# Patient Record
Sex: Female | Born: 1940 | Race: White | Hispanic: No | Marital: Married | Smoking: Former smoker
Health system: Southern US, Community
[De-identification: ages and names within clinical notes are randomized; demographics above are authoritative.]

## PROBLEM LIST (undated history)

## (undated) DIAGNOSIS — R000 Tachycardia, unspecified: Secondary | ICD-10-CM

## (undated) DIAGNOSIS — G4700 Insomnia, unspecified: Secondary | ICD-10-CM

## (undated) DIAGNOSIS — C4490 Unspecified malignant neoplasm of skin, unspecified: Secondary | ICD-10-CM

## (undated) DIAGNOSIS — E785 Hyperlipidemia, unspecified: Secondary | ICD-10-CM

## (undated) DIAGNOSIS — I10 Essential (primary) hypertension: Secondary | ICD-10-CM

## (undated) DIAGNOSIS — M8580 Other specified disorders of bone density and structure, unspecified site: Secondary | ICD-10-CM

## (undated) DIAGNOSIS — F419 Anxiety disorder, unspecified: Secondary | ICD-10-CM

## (undated) DIAGNOSIS — E079 Disorder of thyroid, unspecified: Secondary | ICD-10-CM

## (undated) DIAGNOSIS — K219 Gastro-esophageal reflux disease without esophagitis: Secondary | ICD-10-CM

## (undated) HISTORY — DX: Essential (primary) hypertension: I10

## (undated) HISTORY — DX: Disorder of thyroid, unspecified: E07.9

## (undated) HISTORY — DX: Hyperlipidemia, unspecified: E78.5

## (undated) HISTORY — DX: Tachycardia, unspecified: R00.0

## (undated) HISTORY — PX: TONSILLECTOMY: SUR1361

## (undated) HISTORY — DX: Other specified disorders of bone density and structure, unspecified site: M85.80

## (undated) HISTORY — DX: Insomnia, unspecified: G47.00

## (undated) HISTORY — DX: Gastro-esophageal reflux disease without esophagitis: K21.9

## (undated) HISTORY — DX: Unspecified malignant neoplasm of skin, unspecified: C44.90

## (undated) HISTORY — DX: Anxiety disorder, unspecified: F41.9

---

## 2005-02-16 DIAGNOSIS — J309 Allergic rhinitis, unspecified: Secondary | ICD-10-CM | POA: Insufficient documentation

## 2005-02-16 DIAGNOSIS — F411 Generalized anxiety disorder: Secondary | ICD-10-CM | POA: Insufficient documentation

## 2009-05-17 DIAGNOSIS — C439 Malignant melanoma of skin, unspecified: Secondary | ICD-10-CM

## 2009-05-17 HISTORY — PX: MELANOMA EXCISION: SHX5266

## 2009-05-17 HISTORY — DX: Malignant melanoma of skin, unspecified: C43.9

## 2010-03-04 DIAGNOSIS — M8580 Other specified disorders of bone density and structure, unspecified site: Secondary | ICD-10-CM | POA: Insufficient documentation

## 2010-07-04 ENCOUNTER — Ambulatory Visit: Payer: Self-pay | Admitting: Unknown Physician Specialty

## 2010-07-04 LAB — HM COLONOSCOPY

## 2010-07-07 LAB — PATHOLOGY REPORT

## 2012-11-15 ENCOUNTER — Ambulatory Visit: Payer: Self-pay | Admitting: Unknown Physician Specialty

## 2013-08-16 ENCOUNTER — Ambulatory Visit: Payer: Self-pay | Admitting: Otolaryngology

## 2013-08-24 ENCOUNTER — Ambulatory Visit: Payer: Self-pay | Admitting: Otolaryngology

## 2013-08-30 ENCOUNTER — Ambulatory Visit: Payer: Self-pay | Admitting: Otolaryngology

## 2014-03-23 DIAGNOSIS — H6123 Impacted cerumen, bilateral: Secondary | ICD-10-CM | POA: Diagnosis not present

## 2014-03-23 DIAGNOSIS — H6983 Other specified disorders of Eustachian tube, bilateral: Secondary | ICD-10-CM | POA: Diagnosis not present

## 2014-03-23 DIAGNOSIS — J340 Abscess, furuncle and carbuncle of nose: Secondary | ICD-10-CM | POA: Diagnosis not present

## 2014-04-17 DIAGNOSIS — J40 Bronchitis, not specified as acute or chronic: Secondary | ICD-10-CM | POA: Diagnosis not present

## 2014-04-17 DIAGNOSIS — J329 Chronic sinusitis, unspecified: Secondary | ICD-10-CM | POA: Diagnosis not present

## 2014-05-29 DIAGNOSIS — Z91018 Allergy to other foods: Secondary | ICD-10-CM | POA: Diagnosis not present

## 2014-05-29 DIAGNOSIS — J301 Allergic rhinitis due to pollen: Secondary | ICD-10-CM | POA: Diagnosis not present

## 2014-05-29 DIAGNOSIS — H1045 Other chronic allergic conjunctivitis: Secondary | ICD-10-CM | POA: Diagnosis not present

## 2014-06-09 NOTE — Op Note (Signed)
PATIENT NAME:  Shannon Pennington, Shannon Pennington MR#:  [id number] DATE OF BIRTH:  06/01/1940  DATE OF PROCEDURE:  08/24/2013  PREOPERATIVE DIAGNOSES: Left cerumen impaction and conductive hearing loss.   POSTOPERATIVE DIAGNOSES:  1.  Left cerumen impaction.  2.  Conductive hearing loss.  3.  Tympanic membrane perforation.  4.  Cholesteatoma.   SURGEON: Carloyn Manner, M.D.   ANESTHESIA: General mask anesthesia.   ESTIMATED BLOOD LOSS: Less than 5 mL.   IV FLUIDS: Please see anesthesia record.   COMPLICATIONS: None.   DRAINS/STENT PLACEMENTS: None.   SPECIMENS: None.   INDICATIONS FOR PROCEDURE: The patient is a 74 year old female with history of a persistent cerumen impaction and crusting on her canal wall and inferior tympanic membrane, unable to be removed in the office, presenting for evaluation under anesthesia.   OPERATIVE FINDINGS: A large crusting, feeling unsafe retraction pocket of the inferior tympanic membrane. Following removal of the crust the pocket was noted to have squamous debris beneath it as well as a posterior/inferior TM perforation.   DESCRIPTION OF PROCEDURE: The patient was identified in holding and the benefits and risks of the procedure were discussed and consent was reviewed. The patient was taken to the operating room and placed in the supine position. General mask anesthesia was induced. The operating microscope was brought into the field. An appropriate-size speculum was placed in patient's right external auditory canal. Impacted cerumen and a small amount of squamous debris was removed using a cerumen loop.   Attention was directed to the patient's left ear in a similar fashion. There was a large crust on the posterior and inferior canal wall, as well as the drum.  Using a Constance Holster pick this was slightly lifted up; this demonstrated a cavity beneath this involving the posterior/inferior tympanic membrane. The crusting and squamous debris were removed with a combination of  the Rosen pick and size 5 otologic suction. Visualization and exploration of the retraction pocket revealed a posterior TM perforation and some further squamous debris as well as some attachment of the cholesteatoma to the ossicles and partial erosion.   At this time in Ciprodex drops were placed after further removal of all the wax and care of the patient was transferred to anesthesia.    ____________________________ Jerene Bears, MD ccv:lt D: 08/24/2013 08:46:41 ET T: 08/24/2013 09:21:58 ET JOB#: [id number]  cc: Jerene Bears, MD, <Dictator> Jerene Bears MD ELECTRONICALLY SIGNED 08/29/2013 18:08

## 2014-06-18 ENCOUNTER — Ambulatory Visit: Payer: Self-pay | Admitting: Podiatry

## 2014-06-26 DIAGNOSIS — J301 Allergic rhinitis due to pollen: Secondary | ICD-10-CM | POA: Diagnosis not present

## 2014-06-26 DIAGNOSIS — H7112 Cholesteatoma of tympanum, left ear: Secondary | ICD-10-CM | POA: Diagnosis not present

## 2014-09-09 ENCOUNTER — Encounter: Payer: Self-pay | Admitting: Family Medicine

## 2014-09-11 ENCOUNTER — Telehealth: Payer: Self-pay | Admitting: Family Medicine

## 2014-09-11 MED ORDER — LORAZEPAM 0.5 MG PO TABS: 0.5000 mg | ORAL_TABLET | Freq: Every day | ORAL | Status: DC

## 2014-09-11 NOTE — Telephone Encounter (Signed)
Rx called in to pharmacy. 

## 2014-09-11 NOTE — Telephone Encounter (Signed)
Please call in rx for lorazepam 0.5mg  one daily as needed, #30 rx x 2. Thanks.       Will you refill my prescription for Lorazepam 0.5mg  tablets at [facility] on [address] [place], [place] ? My current RX # [id number] and I have two pills left.

## 2014-09-14 ENCOUNTER — Other Ambulatory Visit: Payer: Self-pay | Admitting: Family Medicine

## 2014-09-19 ENCOUNTER — Ambulatory Visit (INDEPENDENT_AMBULATORY_CARE_PROVIDER_SITE_OTHER): Payer: Medicare Other | Admitting: Family Medicine

## 2014-09-19 ENCOUNTER — Encounter: Payer: Self-pay | Admitting: Family Medicine

## 2014-09-19 VITALS — BP 140/84 | HR 64 | Temp 97.9°F | Resp 16 | Wt 120.0 lb

## 2014-09-19 DIAGNOSIS — R000 Tachycardia, unspecified: Secondary | ICD-10-CM | POA: Insufficient documentation

## 2014-09-19 DIAGNOSIS — L03011 Cellulitis of right finger: Secondary | ICD-10-CM

## 2014-09-19 DIAGNOSIS — K219 Gastro-esophageal reflux disease without esophagitis: Secondary | ICD-10-CM | POA: Insufficient documentation

## 2014-09-19 DIAGNOSIS — G4700 Insomnia, unspecified: Secondary | ICD-10-CM | POA: Insufficient documentation

## 2014-09-19 DIAGNOSIS — E785 Hyperlipidemia, unspecified: Secondary | ICD-10-CM | POA: Insufficient documentation

## 2014-09-19 DIAGNOSIS — R7989 Other specified abnormal findings of blood chemistry: Secondary | ICD-10-CM | POA: Insufficient documentation

## 2014-09-19 DIAGNOSIS — I10 Essential (primary) hypertension: Secondary | ICD-10-CM | POA: Insufficient documentation

## 2014-09-19 MED ORDER — CEPHALEXIN 500 MG PO CAPS: 500.0000 mg | ORAL_CAPSULE | Freq: Two times a day (BID) | ORAL | Status: DC

## 2014-09-19 NOTE — Progress Notes (Signed)
Subjective:     Patient ID: Shannon Pennington, female   DOB: 03-Sep-1940, 74 y.o.   MRN: [id number]  HPI  Chief Complaint  Patient presents with  . Hand Pain    Patient comes in office today with concerns of swelling and pain on her right hand index finger for the past 2days. Patient does not recall injury to finger  States she does manipulate her fingernails at times. Accompanied by her husband today.   Review of Systems  Constitutional: Negative for fever and chills.       Objective:   Physical Exam  Constitutional: She appears well-developed and well-nourished. No distress.  Skin:  Right second finger with early paronychia. Mild erythema with early pus pocket formation (not pointing)       Assessment:    1. Paronychia of finger of right hand - cephALEXin (KEFLEX) 500 MG capsule; Take 1 capsule (500 mg total) by mouth 2 (two) times daily.  Dispense: 14 capsule; Refill: 0    Plan:    Start salt water soaks. If not improving in 24 hours to start the antibiotic.

## 2014-09-19 NOTE — Patient Instructions (Signed)
Salt water soaks in warm water 3 x day. If not improving in 24 hours start antibiotic. If there is a pus head you may use a sterile pin (soak in alcohol) and let pus out.

## 2014-10-08 ENCOUNTER — Encounter: Payer: Self-pay | Admitting: Family Medicine

## 2014-10-08 ENCOUNTER — Ambulatory Visit (INDEPENDENT_AMBULATORY_CARE_PROVIDER_SITE_OTHER): Payer: Medicare Other | Admitting: Family Medicine

## 2014-10-08 VITALS — BP 120/80 | HR 62 | Temp 97.7°F | Resp 16 | Ht 63.0 in | Wt 119.0 lb

## 2014-10-08 DIAGNOSIS — R7989 Other specified abnormal findings of blood chemistry: Secondary | ICD-10-CM

## 2014-10-08 DIAGNOSIS — I10 Essential (primary) hypertension: Secondary | ICD-10-CM

## 2014-10-08 DIAGNOSIS — E2839 Other primary ovarian failure: Secondary | ICD-10-CM

## 2014-10-08 DIAGNOSIS — N3000 Acute cystitis without hematuria: Secondary | ICD-10-CM | POA: Diagnosis not present

## 2014-10-08 DIAGNOSIS — E785 Hyperlipidemia, unspecified: Secondary | ICD-10-CM

## 2014-10-08 DIAGNOSIS — Z0000 Encounter for general adult medical examination without abnormal findings: Secondary | ICD-10-CM

## 2014-10-08 DIAGNOSIS — R946 Abnormal results of thyroid function studies: Secondary | ICD-10-CM

## 2014-10-08 DIAGNOSIS — N390 Urinary tract infection, site not specified: Secondary | ICD-10-CM | POA: Insufficient documentation

## 2014-10-08 DIAGNOSIS — G4700 Insomnia, unspecified: Secondary | ICD-10-CM | POA: Diagnosis not present

## 2014-10-08 MED ORDER — CIPROFLOXACIN HCL 500 MG PO TABS: 500.0000 mg | ORAL_TABLET | Freq: Two times a day (BID) | ORAL | Status: AC

## 2014-10-08 NOTE — Progress Notes (Signed)
Patient: Shannon Pennington, Female    DOB: [birth year], 74 y.o.   MRN: [id number] Visit Date: 10/08/2014  Today's Provider: Lelon Huh, MD   Chief Complaint  Patient presents with  . Hypertension  . Insomnia  . Anxiety  . Annual Exam   Subjective:    Yearly physical  Shannon Pennington is a 74 y.o. female. She feels well. She reports exercising yes. She reports she is sleeping fairly well.  -----------------------------------------------------------  Follow-up for anxiety from 09/25/2013; no changes were made. Follow-up for insomnia from 09/25/2013; restarted Temazepam 30 mg. Follow-up for osteopenia from 09/25/2013; recommended BMD, patient refused.   Hypertension, follow-up:  BP Readings from Last 3 Encounters:  10/08/14 120/80  09/19/14 140/84  04/17/14 130/72    She was last seen for hypertension 1 years ago.  BP at that visit was 150/82. Management changes since that visit include none. She reports excellent compliance with treatment. She is not having side effects. none  She is exercising. She is not adherent to low salt diet.   Outside blood pressures are 120/80. She is experiencing none.  Patient denies none.   Cardiovascular risk factors include none.  Use of agents associated with hypertension: none.     Weight trend: stable Wt Readings from Last 3 Encounters:  10/08/14 119 lb (53.978 kg)  09/19/14 120 lb (54.432 kg)  04/17/14 124 lb (56.246 kg)    Current diet: well balanced  ------------------------------------------------------------------------    Review of Systems  Constitutional: Negative.   HENT: Positive for congestion, sinus pressure and tinnitus.   Eyes: Negative.   Respiratory: Negative.   Cardiovascular: Negative.   Gastrointestinal: Positive for constipation. Negative for nausea, vomiting, abdominal pain, diarrhea, blood in stool, abdominal distention, anal bleeding and rectal pain.  Endocrine: Negative.   Genitourinary:  Negative.   Musculoskeletal: Negative.   Skin: Negative.   Allergic/Immunologic: Positive for environmental allergies. Negative for food allergies and immunocompromised state.  Neurological: Negative.   Hematological: Negative.   Psychiatric/Behavioral: Negative.     Social History   Social History  . Marital Status: Married    Spouse Name: N/A  . Number of Children: N/A  . Years of Education: N/A   Occupational History  . Not on file.   Social History Main Topics  . Smoking status: Former Smoker    Quit date: 02/16/1990  . Smokeless tobacco: Not on file  . Alcohol Use: No  . Drug Use: No  . Sexual Activity: Not on file   Other Topics Concern  . Not on file   Social History Narrative    Patient Active Problem List   Diagnosis Date Noted  . Acid reflux 09/19/2014  . HLD (hyperlipidemia) 09/19/2014  . BP (high blood pressure) 09/19/2014  . Cannot sleep 09/19/2014  . Fast heart beat 09/19/2014  . TSH elevation 09/19/2014  . Osteopenia 03/04/2010  . Allergic rhinitis 02/16/2005  . Anxiety state 02/16/2005    Past Surgical History  Procedure Laterality Date  . Tonsillectomy    . Melanoma excision Left 05/2009    [facility]    Her family history includes Cancer in her brother and father.    Previous Medications   CEPHALEXIN (KEFLEX) 500 MG CAPSULE    Take 1 capsule (500 mg total) by mouth 2 (two) times daily.   FLUTICASONE (FLONASE) 50 MCG/ACT NASAL SPRAY    USE 1-2 SPRAYS IN EACH NOSTRIL ONCE A DAY   HYDROCHLOROTHIAZIDE (HYDRODIURIL) 25 MG  TABLET    TAKE 1 TABLET BY MOUTH DAILY   LORAZEPAM (ATIVAN) 0.5 MG TABLET    Take 1 tablet (0.5 mg total) by mouth daily.   METOPROLOL SUCCINATE (TOPROL-XL) 25 MG 24 HR TABLET    Take 12.5 mg by mouth daily.    Patient Care Team: Birdie Sons, MD as PCP - General (Family Medicine)     Objective:   Vitals: BP 120/80 mmHg  Pulse 62  Temp(Src) 97.7 F (36.5 C) (Oral)  Resp 16  Ht 5\' 3"  (1.6 m)  Wt  119 lb (53.978 kg)  BMI 21.09 kg/m2  Physical Exam    General Appearance:    Alert, cooperative, no distress, appears stated age. thin  Head:    Normocephalic, without obvious abnormality, atraumatic  Eyes:    PERRL, conjunctiva/corneas clear, EOM's intact, fundi    benign, both eyes  Ears:    Normal TM's and external ear canals, both ears  Nose:   Nares normal, septum midline, mucosa normal, no drainage    or sinus tenderness  Throat:   Lips, mucosa, and tongue normal; teeth and gums normal  Neck:   Supple, symmetrical, trachea midline, no adenopathy;    thyroid:  no enlargement/tenderness/nodules; no carotid   bruit or JVD  Back:     Symmetric, no curvature, ROM normal, no CVA tenderness  Lungs:     Clear to auscultation bilaterally, respirations unlabored  Chest Wall:    No tenderness or deformity   Heart:    Regular rate and rhythm, S1 and S2 normal, no murmur, rub   or gallop  Breast Exam:    deferred  Abdomen:     Soft, non-tender, bowel sounds active all four quadrants,    no masses, no organomegaly  Pelvic:    deferred  Extremities:   Extremities normal, atraumatic, no cyanosis or edema  Pulses:   2+ and symmetric all extremities  Skin:   Skin color, texture, turgor normal, no rashes or lesions  Lymph nodes:   Cervical, supraclavicular, and axillary nodes normal  Neurologic:   CNII-XII intact, normal strength, sensation and reflexes    throughout    .Activities of Daily Living In your present state of health, do you have any difficulty performing the following activities: 10/08/2014  Hearing? Y  Vision? N  Difficulty concentrating or making decisions? N  Walking or climbing stairs? N  Dressing or bathing? N  Doing errands, shopping? N    Fall Risk Assessment Fall Risk  10/08/2014  Falls in the past year? No     Depression Screen PHQ 2/9 Scores 10/08/2014  PHQ - 2 Score 0  PHQ- 9 Score 4   Patient refused cognitive testing.  Cognitive Testing -  6-CIT      Assessment & Plan:     Yearly physical  Reviewed patient's Family Medical History Reviewed and updated list of patient's medical providers Assessment of cognitive impairment was done Assessed patient's functional ability Established a written schedule for health screening [place] Completed and Reviewed  Exercise Activities and Dietary recommendations Goals    None      Immunization History  Administered Date(s) Administered  . Influenza-Unspecified 10/17/2012        Discussed health benefits of physical activity, and encouraged her to engage in regular exercise appropriate for her age and condition.    ------------------------------------------------------------------------------------------------------------ 1. Annual physical exam Feeling well with normal exam. She is very reluctant to get mammogram. She was strongly encouraged  to get regular screening and given Number to schedule at [facility].   2. HLD (hyperlipidemia)  - Lipid panel - CBC  3. Insomnia Does well with occasional lorazepam.   4. TSH elevation  - TSH  5. Acute cystitis without hematuria Has now resolved, but she would like to have some antibiotic on hand in case symptoms return. 26. Essential hypertension well controlled. Continue current medications.   - Renal function panel  7. Estrogen deficiency  - DG Bone Density; Future

## 2014-10-08 NOTE — Patient Instructions (Signed)
You need to get a mammogram to make sure you don't develop breast cancer.  Please call the [place] at [facility] to schedule this at [phone]

## 2014-10-09 ENCOUNTER — Telehealth: Payer: Self-pay

## 2014-10-09 LAB — CBC
HEMATOCRIT: 44.4 % (ref 34.0–46.6)
HEMOGLOBIN: 15.1 g/dL (ref 11.1–15.9)
MCH: 30.6 pg (ref 26.6–33.0)
MCHC: 34 g/dL (ref 31.5–35.7)
MCV: 90 fL (ref 79–97)
Platelets: 309 10*3/uL (ref 150–379)
RBC: 4.93 x10E6/uL (ref 3.77–5.28)
RDW: 13.3 % (ref 12.3–15.4)
WBC: 7.2 10*3/uL (ref 3.4–10.8)

## 2014-10-09 LAB — RENAL FUNCTION PANEL
ALBUMIN: 4.5 g/dL (ref 3.5–4.8)
BUN / CREAT RATIO: 10 — AB (ref 11–26)
BUN: 10 mg/dL (ref 8–27)
CALCIUM: 9.8 mg/dL (ref 8.7–10.3)
CO2: 27 mmol/L (ref 18–29)
CREATININE: 0.98 mg/dL (ref 0.57–1.00)
Chloride: 99 mmol/L (ref 97–108)
GFR calc Af Amer: 67 mL/min/{1.73_m2} (ref >59)
GFR calc non Af Amer: 58 mL/min/{1.73_m2} — ABNORMAL LOW (ref >59)
Glucose: 106 mg/dL — ABNORMAL HIGH (ref 65–99)
PHOSPHORUS: 4.3 mg/dL (ref 2.5–4.5)
Potassium: 5 mmol/L (ref 3.5–5.2)
Sodium: 143 mmol/L (ref 134–144)

## 2014-10-09 LAB — LIPID PANEL
CHOL/HDL RATIO: 2.6 ratio (ref 0.0–4.4)
Cholesterol, Total: 158 mg/dL (ref 100–199)
HDL: 61 mg/dL (ref >39)
LDL Calculated: 76 mg/dL (ref 0–99)
Triglycerides: 103 mg/dL (ref 0–149)
VLDL Cholesterol Cal: 21 mg/dL (ref 5–40)

## 2014-10-09 LAB — TSH: TSH: 3.53 u[IU]/mL (ref 0.450–4.500)

## 2014-10-09 NOTE — Telephone Encounter (Signed)
Advised pt as directed below. Pt verbalized fully understanding.  Thanks,

## 2014-10-09 NOTE — Telephone Encounter (Signed)
-----   Message from Birdie Sons, MD sent at 10/09/2014  8:04 AM EDT ----- All labs including cholesterol, blood sugar thyroid, and kidney functions are good. Continue current medications and follow up in 6 months.

## 2014-10-16 ENCOUNTER — Ambulatory Visit
Admission: RE | Admit: 2014-10-16 | Discharge: 2014-10-16 | Disposition: A | Discharge status: Home / Self Care | Payer: Medicare Other | Source: Ambulatory Visit | Attending: Family Medicine | Admitting: Family Medicine

## 2014-10-16 DIAGNOSIS — M8580 Other specified disorders of bone density and structure, unspecified site: Secondary | ICD-10-CM | POA: Diagnosis not present

## 2014-10-16 DIAGNOSIS — E2839 Other primary ovarian failure: Secondary | ICD-10-CM | POA: Insufficient documentation

## 2014-11-19 ENCOUNTER — Other Ambulatory Visit: Payer: Self-pay | Admitting: Family Medicine

## 2014-11-19 ENCOUNTER — Encounter: Payer: Self-pay | Admitting: Family Medicine

## 2014-12-03 ENCOUNTER — Encounter: Payer: Self-pay | Admitting: Family Medicine

## 2014-12-06 ENCOUNTER — Other Ambulatory Visit: Payer: Self-pay | Admitting: Family Medicine

## 2014-12-06 NOTE — Telephone Encounter (Signed)
Rx called in to pharmacy. 

## 2014-12-06 NOTE — Telephone Encounter (Signed)
Please call in lorazepam.  

## 2014-12-31 ENCOUNTER — Other Ambulatory Visit: Payer: Self-pay | Admitting: Family Medicine

## 2015-01-02 ENCOUNTER — Other Ambulatory Visit: Payer: Self-pay | Admitting: Family Medicine

## 2015-01-02 ENCOUNTER — Telehealth: Payer: Self-pay

## 2015-01-02 NOTE — Telephone Encounter (Signed)
Patient's husband states he went to pharmacy to pick up RX. Husband states patient needs the compound medication that was prescribed before the Lidocaine, Lidocaine does not help patient.   He could not remember the name of the medication, but states it does have Maalox in it.  Pharmacy- [facility] which will also be sending a request per patient's husband.

## 2015-01-04 MED ORDER — FIRST-DUKES MOUTHWASH MT SUSP: OROMUCOSAL | Status: DC

## 2015-01-06 ENCOUNTER — Other Ambulatory Visit: Payer: Self-pay | Admitting: Family Medicine

## 2015-01-06 NOTE — Telephone Encounter (Signed)
Please call in lorazepam.  

## 2015-01-07 ENCOUNTER — Other Ambulatory Visit: Payer: Self-pay | Admitting: Family Medicine

## 2015-01-07 ENCOUNTER — Encounter: Payer: Self-pay | Admitting: Family Medicine

## 2015-01-08 ENCOUNTER — Other Ambulatory Visit: Payer: Self-pay | Admitting: Family Medicine

## 2015-01-08 NOTE — Telephone Encounter (Signed)
Please call in lorazepam if not already done.

## 2015-01-08 NOTE — Telephone Encounter (Signed)
Pt's husband stated the LORazepam (ATIVAN) 0.5 MG tablet that has been approved to be called in hasn't been called in and they would really like this done today b/c they are going out of town. Thanks TNP

## 2015-01-08 NOTE — Telephone Encounter (Signed)
Done. Prescription called into pharmacy.  

## 2015-01-08 NOTE — Telephone Encounter (Signed)
Rx was called into pharmacy this morning. Message was left on vm.

## 2015-01-15 DIAGNOSIS — L821 Other seborrheic keratosis: Secondary | ICD-10-CM | POA: Diagnosis not present

## 2015-01-15 DIAGNOSIS — L820 Inflamed seborrheic keratosis: Secondary | ICD-10-CM | POA: Diagnosis not present

## 2015-01-15 DIAGNOSIS — L570 Actinic keratosis: Secondary | ICD-10-CM | POA: Diagnosis not present

## 2015-02-04 DIAGNOSIS — J301 Allergic rhinitis due to pollen: Secondary | ICD-10-CM | POA: Diagnosis not present

## 2015-02-04 DIAGNOSIS — H7112 Cholesteatoma of tympanum, left ear: Secondary | ICD-10-CM | POA: Diagnosis not present

## 2015-03-28 DIAGNOSIS — N951 Menopausal and female climacteric states: Secondary | ICD-10-CM | POA: Diagnosis not present

## 2015-04-12 ENCOUNTER — Encounter: Payer: Self-pay | Admitting: Emergency Medicine

## 2015-04-12 ENCOUNTER — Emergency Department: Payer: Medicare Other

## 2015-04-12 ENCOUNTER — Emergency Department
Admission: EM | Admit: 2015-04-12 | Discharge: 2015-04-12 | Disposition: A | Discharge status: Home / Self Care | Payer: Medicare Other | Attending: Emergency Medicine | Admitting: Emergency Medicine

## 2015-04-12 DIAGNOSIS — Z7951 Long term (current) use of inhaled steroids: Secondary | ICD-10-CM | POA: Insufficient documentation

## 2015-04-12 DIAGNOSIS — Z743 Need for continuous supervision: Secondary | ICD-10-CM | POA: Diagnosis not present

## 2015-04-12 DIAGNOSIS — R05 Cough: Secondary | ICD-10-CM | POA: Diagnosis not present

## 2015-04-12 DIAGNOSIS — R55 Syncope and collapse: Secondary | ICD-10-CM | POA: Insufficient documentation

## 2015-04-12 DIAGNOSIS — I10 Essential (primary) hypertension: Secondary | ICD-10-CM | POA: Insufficient documentation

## 2015-04-12 DIAGNOSIS — R531 Weakness: Secondary | ICD-10-CM | POA: Diagnosis not present

## 2015-04-12 DIAGNOSIS — Z87891 Personal history of nicotine dependence: Secondary | ICD-10-CM | POA: Insufficient documentation

## 2015-04-12 DIAGNOSIS — Z79899 Other long term (current) drug therapy: Secondary | ICD-10-CM | POA: Insufficient documentation

## 2015-04-12 DIAGNOSIS — R509 Fever, unspecified: Secondary | ICD-10-CM | POA: Diagnosis not present

## 2015-04-12 DIAGNOSIS — J209 Acute bronchitis, unspecified: Secondary | ICD-10-CM | POA: Insufficient documentation

## 2015-04-12 DIAGNOSIS — I959 Hypotension, unspecified: Secondary | ICD-10-CM | POA: Diagnosis not present

## 2015-04-12 DIAGNOSIS — S0990XA Unspecified injury of head, initial encounter: Secondary | ICD-10-CM | POA: Diagnosis not present

## 2015-04-12 LAB — COMPREHENSIVE METABOLIC PANEL
ALBUMIN: 3.6 g/dL (ref 3.5–5.0)
ALT: 25 U/L (ref 14–54)
ANION GAP: 9 (ref 5–15)
AST: 35 U/L (ref 15–41)
Alkaline Phosphatase: 55 U/L (ref 38–126)
BILIRUBIN TOTAL: 0.5 mg/dL (ref 0.3–1.2)
BUN: 17 mg/dL (ref 6–20)
CO2: 29 mmol/L (ref 22–32)
Calcium: 8.5 mg/dL — ABNORMAL LOW (ref 8.9–10.3)
Chloride: 98 mmol/L — ABNORMAL LOW (ref 101–111)
Creatinine, Ser: 1.34 mg/dL — ABNORMAL HIGH (ref 0.44–1.00)
GFR calc Af Amer: 45 mL/min — ABNORMAL LOW (ref >60)
GFR calc non Af Amer: 39 mL/min — ABNORMAL LOW (ref >60)
GLUCOSE: 126 mg/dL — AB (ref 65–99)
POTASSIUM: 3.3 mmol/L — AB (ref 3.5–5.1)
SODIUM: 136 mmol/L (ref 135–145)
Total Protein: 6.4 g/dL — ABNORMAL LOW (ref 6.5–8.1)

## 2015-04-12 LAB — CBC WITH DIFFERENTIAL/PLATELET
BASOS ABS: 0 10*3/uL (ref 0–0.1)
BASOS PCT: 1 %
EOS ABS: 0 10*3/uL (ref 0–0.7)
Eosinophils Relative: 1 %
HEMATOCRIT: 42.3 % (ref 35.0–47.0)
HEMOGLOBIN: 14.6 g/dL (ref 12.0–16.0)
Lymphocytes Relative: 33 %
Lymphs Abs: 1.3 10*3/uL (ref 1.0–3.6)
MCH: 30.6 pg (ref 26.0–34.0)
MCHC: 34.5 g/dL (ref 32.0–36.0)
MCV: 88.7 fL (ref 80.0–100.0)
MONO ABS: 0.6 10*3/uL (ref 0.2–0.9)
MONOS PCT: 14 %
NEUTROS ABS: 2.1 10*3/uL (ref 1.4–6.5)
NEUTROS PCT: 51 %
Platelets: 174 10*3/uL (ref 150–440)
RBC: 4.77 MIL/uL (ref 3.80–5.20)
RDW: 13.7 % (ref 11.5–14.5)
WBC: 4.1 10*3/uL (ref 3.6–11.0)

## 2015-04-12 LAB — URINALYSIS COMPLETE WITH MICROSCOPIC (ARMC ONLY)
BACTERIA UA: NONE SEEN
Bilirubin Urine: NEGATIVE
Glucose, UA: NEGATIVE mg/dL
HGB URINE DIPSTICK: NEGATIVE
KETONES UR: NEGATIVE mg/dL
LEUKOCYTES UA: NEGATIVE
NITRITE: NEGATIVE
PH: 6 (ref 5.0–8.0)
PROTEIN: NEGATIVE mg/dL
SPECIFIC GRAVITY, URINE: 1.013 (ref 1.005–1.030)

## 2015-04-12 LAB — TROPONIN I: Troponin I: <0.03 ng/mL (ref <0.031)

## 2015-04-12 MED ORDER — AZITHROMYCIN 250 MG PO TABS: ORAL_TABLET | ORAL | Status: DC

## 2015-04-12 MED ORDER — SODIUM CHLORIDE 0.9 % IV BOLUS (SEPSIS)
1000.0000 mL | Freq: Once | INTRAVENOUS | Status: AC
  Administered 2015-04-12: 1000 mL via INTRAVENOUS

## 2015-04-12 MED ORDER — POTASSIUM CHLORIDE CRYS ER 20 MEQ PO TBCR
20.0000 meq | EXTENDED_RELEASE_TABLET | Freq: Once | ORAL | Status: AC
  Administered 2015-04-12: 20 meq via ORAL
  Filled 2015-04-12: qty 1

## 2015-04-12 NOTE — ED Notes (Signed)
Called lab twice to verify chemistry results Dr. Beather Arbour speaking with chemistry Tech.

## 2015-04-12 NOTE — ED Provider Notes (Signed)
-----------------------------------------   9:07 AM on 04/12/2015 -----------------------------------------   Blood pressure 132/58, pulse 57, temperature 97.9 F (36.6 C), temperature source Oral, resp. rate 17, height 5\' 3"  (1.6 m), weight 117 lb (53.071 kg), SpO2 97 %.  Assuming care from Dr. Dolphus Jenny.  In short, Shannon Pennington is a 75 y.o. female with a chief complaint of Loss of Consciousness .  Refer to the original H&P for additional details.  The current plan of care is to follow patient's laboratory work. Her potassium is slightly low and she appears to be dehydrated with some mild renal insufficiency. The patient's able tolerate oral fluids and was advised to push fluids at home. He is been suffering from what appears to be acute bronchitis with productive cough and requested antibiotic therapy. Patient was advised drink plenty of fluids and was prescribed a Zithromax on an outpatient basis and to follow up with her primary physician. Felt based on her clinical evaluation this was not cardiogenic syncope.   Daymon Larsen, MD 04/12/15 [phone]

## 2015-04-12 NOTE — ED Notes (Signed)
Dr. Sung at bedside.  

## 2015-04-12 NOTE — ED Notes (Signed)
Assisted pt with bedpan

## 2015-04-12 NOTE — ED Notes (Signed)
Pt unable to void 

## 2015-04-12 NOTE — ED Notes (Signed)
Pt uses hearing aid to left ear. Did not bring hearing aid.

## 2015-04-12 NOTE — ED Notes (Signed)
Pt presents to ER from home via EMS. Per EMS report pt woke up to get some water and husband found pt on the floor. Pt reports she does not recall passing out , denies any pain reports generalized weakness. Per EMS CBG 140. Pt is awake alert and oriented.

## 2015-04-12 NOTE — ED Provider Notes (Signed)
[facility] Emergency Department Provider Note  ____________________________________________  Time seen: Approximately 4:09 AM  I have reviewed the triage vital signs and the nursing notes.   HISTORY  Chief Complaint Loss of Consciousness    HPI Shannon Pennington is a 75 y.o. female who presents to the ED from home via EMS with a chief complaint of syncope. Patient reports she has been experiencing flulike symptoms for the past several days including nonproductive cough and congestion. Patient states she awoke from sleep with cough, took some cough medicine and return to bed. Subsequently she went to the kitchen with the intention of getting a drink of water secondary to dry mouth. That is the last thing patient remembers. Spouse awoke when he heard a loud noise in the kitchen. States he found her laying on her back on the floor and seeming dazed.Denies pain but complains of generalized weakness. Denies fever, chills, chest pain, shortness of breath, abdominal pain, nausea, vomiting, diarrhea. Denies recent travel.   Past Medical History  Diagnosis Date  . Hyperlipidemia   . Anxiety   . GERD (gastroesophageal reflux disease)   . Hypertension   . Tachycardia   . Osteopenia   . Insomnia     Patient Active Problem List   Diagnosis Date Noted  . Urinary tract infection 10/08/2014  . Acid reflux 09/19/2014  . HLD (hyperlipidemia) 09/19/2014  . Hypertension 09/19/2014  . Insomnia 09/19/2014  . Fast heart beat 09/19/2014  . TSH elevation 09/19/2014  . Osteopenia 03/04/2010  . Allergic rhinitis 02/16/2005  . Anxiety state 02/16/2005    Past Surgical History  Procedure Laterality Date  . Tonsillectomy    . Melanoma excision Left 05/2009    [facility]    Current Outpatient Rx  Name  Route  Sig  Dispense  Refill  . atorvastatin (LIPITOR) 10 MG tablet      Take 1 tablet by mouth  every evening   90 tablet   4   . azelastine (ASTELIN)  0.1 % nasal spray   Each Nare   Place 2 sprays into both nostrils every 12 (twelve) hours as needed.         Marland Kitchen estradiol (ESTRACE) 1 MG tablet   Oral   Take 1 mg by mouth daily.         . fluticasone (FLONASE) 50 MCG/ACT nasal spray      USE 1-2 SPRAYS IN EACH NOSTRIL ONCE A DAY      3   . hydrochlorothiazide (HYDRODIURIL) 25 MG tablet      TAKE 1 TABLET BY MOUTH DAILY   30 tablet   11   . LORazepam (ATIVAN) 0.5 MG tablet      TAKE 1 TABLET EVERY DAY   30 tablet   5   . medroxyPROGESTERone (PROVERA) 2.5 MG tablet   Oral   Take 2.5 mg by mouth daily.         . metoprolol succinate (TOPROL-XL) 25 MG 24 hr tablet   Oral   Take 12.5 mg by mouth daily.      6   . Diphenhyd-Hydrocort-Nystatin (FIRST-DUKES MOUTHWASH) SUSP      Gargle and swallow 5 milliliters up to four times daily Patient not taking: Reported on 04/12/2015   237 mL   1   . lidocaine (XYLOCAINE) 2 % solution      SWISH AND SPIT 1 TO 2 TEASPOONSFUL BY MOUTH 3 TO 4 TIMES A DAY Patient not taking:  Reported on 04/12/2015   100 mL   1     Ingredients: LIDOCAINE/[facility] ANTAC/DIPHENHIS.     Allergies Sulfa antibiotics  Family History  Problem Relation Age of Onset  . Cancer Father     lung  . Cancer Brother     lung    Social History Social History  Substance Use Topics  . Smoking status: Former Smoker    Quit date: 02/16/1990  . Smokeless tobacco: None  . Alcohol Use: No    Review of Systems  Constitutional: Positive for generalized weakness. No fever/chills. Eyes: No visual changes. ENT: No sore throat. Cardiovascular: Denies chest pain. Respiratory: Positive for nonproductive cough. Denies shortness of breath. Gastrointestinal: No abdominal pain.  No nausea, no vomiting.  No diarrhea.  No constipation. Genitourinary: Negative for dysuria. Musculoskeletal: Negative for back pain. Skin: Negative for rash. Neurological: Negative for headaches, focal weakness or  numbness.  10-point ROS otherwise negative.  ____________________________________________   PHYSICAL EXAM:  VITAL SIGNS: ED Triage Vitals  Enc Vitals Group     BP 04/12/15 0409 122/62 mmHg     Pulse Rate 04/12/15 0409 62     Resp 04/12/15 0409 16     Temp 04/12/15 0409 97.9 F (36.6 C)     Temp Source 04/12/15 0409 Oral     SpO2 04/12/15 0409 98 %     Weight 04/12/15 0409 117 lb (53.071 kg)     Height 04/12/15 0409 5\' 3"  (1.6 m)     Head Cir --      Peak Flow --      Pain Score --      Pain Loc --      Pain Edu? --      Excl. in [place]? --     Constitutional: Alert and oriented. Well appearing and in no acute distress. Eyes: Conjunctivae are normal. PERRL. EOMI. Head: Atraumatic. Nose: Congestion/rhinnorhea. Mouth/Throat: Mucous membranes are moist.  Oropharynx non-erythematous. Neck: No stridor.  No cervical spine tenderness to palpation.  No carotid bruits. Cardiovascular: Normal rate, regular rhythm. Grossly normal heart sounds.  Good peripheral circulation. Respiratory: Normal respiratory effort.  No retractions. Lungs CTAB. Gastrointestinal: Soft and nontender. No distention. No abdominal bruits. No CVA tenderness. Musculoskeletal: No lower extremity tenderness nor edema.  No joint effusions. Neurologic:  Normal speech and language. No gross focal neurologic deficits are appreciated.  Skin:  Skin is warm, dry and intact. No rash noted. Psychiatric: Mood and affect are normal. Speech and behavior are normal.  ____________________________________________   LABS (all labs ordered are listed, but only abnormal results are displayed)  Labs Reviewed  URINALYSIS COMPLETEWITH MICROSCOPIC ([place] ONLY) - Abnormal; Notable for the following:    Color, Urine YELLOW (*)    APPearance CLEAR (*)    Squamous Epithelial / LPF 0-5 (*)    All other components within normal limits  CBC WITH DIFFERENTIAL/PLATELET  COMPREHENSIVE METABOLIC PANEL  TROPONIN I    ____________________________________________  EKG  ED ECG REPORT I, Marranda Arakelian J, the attending physician, personally viewed and interpreted this ECG.   Date: 04/12/2015  EKG Time: 0409  Rate: 54  Rhythm: sinus bradycardia  Axis: Normal  Intervals:none  ST&T Change: Nonspecific  ____________________________________________  RADIOLOGY  CT head interpreted per Dr. Radene Knee: 1. No evidence of traumatic intracranial injury or fracture. 2. Mild cortical volume loss and scattered small vessel ischemic Microangiopathy.  Chest x-ray (viewed by me, interpreted per Dr. Radene Knee): No acute cardiopulmonary process seen. ____________________________________________   PROCEDURES  Procedure(s) performed: None  Critical Care performed: No  ____________________________________________   INITIAL IMPRESSION / ASSESSMENT AND PLAN / ED COURSE  Pertinent labs & imaging results that were available during my care of the patient were reviewed by me and considered in my medical decision making (see chart for details).  75 year old female with recent URI who presents s/p syncopal episode at home. Patient is currently alert and oriented 4 without focal neurological deficit. Will obtain screening lab work including troponin, urinalysis, and obtain CT head as well as portable chest x-ray.  ----------------------------------------- 6:50 AM on 04/12/2015 -----------------------------------------  Apologize for delay in obtaining laboratory results. I did speak with chemistry tech who notes no critical abnormalities with chemistry panel. However the troponin is still pending. I did update patient and spouse of imaging and urinalysis results. Will obtain orthostatic vital signs to determine if patient requires additional IV fluids. I did discuss with them that if the lab work and troponin are unremarkable and she continues to feel improved then I anticipate she will be able to be discharged  home. ____________________________________________   FINAL CLINICAL IMPRESSION(S) / ED DIAGNOSES  Final diagnoses:  Syncope, unspecified syncope type      Paulette Blanch, MD 04/12/15 [phone]

## 2015-04-12 NOTE — Discharge Instructions (Signed)
1. Drink plenty of fluids daily. 2. Return to the ER for worsening symptoms, persistent vomiting, difficulty breathing or other concerns.  Syncope Syncope is a medical term for fainting or passing out. This means you lose consciousness and drop to the ground. People are generally unconscious for less than 5 minutes. You may have some muscle twitches for up to 15 seconds before waking up and returning to normal. Syncope occurs more often in older adults, but it can happen to anyone. While most causes of syncope are not dangerous, syncope can be a sign of a serious medical problem. It is important to seek medical care.  CAUSES  Syncope is caused by a sudden drop in blood flow to the brain. The specific cause is often not determined. Factors that can bring on syncope include:  Taking medicines that lower blood pressure.  Sudden changes in posture, such as standing up quickly.  Taking more medicine than prescribed.  Standing in one place for too long.  Seizure disorders.  Dehydration and excessive exposure to heat.  Low blood sugar (hypoglycemia).  Straining to have a bowel movement.  Heart disease, irregular heartbeat, or other circulatory problems.  Fear, emotional distress, seeing blood, or severe pain. SYMPTOMS  Right before fainting, you may:  Feel dizzy or light-headed.  Feel nauseous.  See all white or all black in your field of vision.  Have cold, clammy skin. DIAGNOSIS  Your health care provider will ask about your symptoms, perform a physical exam, and perform an electrocardiogram (ECG) to record the electrical activity of your heart. Your health care provider may also perform other heart or blood tests to determine the cause of your syncope which may include:  Transthoracic echocardiogram (TTE). During echocardiography, sound waves are used to evaluate how blood flows through your heart.  Transesophageal echocardiogram (TEE).  Cardiac monitoring. This allows your  health care provider to monitor your heart rate and rhythm in real time.  Holter monitor. This is a portable device that records your heartbeat and can help diagnose heart arrhythmias. It allows your health care provider to track your heart activity for several days, if needed.  Stress tests by exercise or by giving medicine that makes the heart beat faster. TREATMENT  In most cases, no treatment is needed. Depending on the cause of your syncope, your health care provider may recommend changing or stopping some of your medicines. HOME CARE INSTRUCTIONS  Have someone stay with you until you feel stable.  Do not drive, use machinery, or play sports until your health care provider says it is okay.  Keep all follow-up appointments as directed by your health care provider.  Lie down right away if you start feeling like you might faint. Breathe deeply and steadily. Wait until all the symptoms have passed.  Drink enough fluids to keep your urine clear or pale yellow.  If you are taking blood pressure or heart medicine, get up slowly and take several minutes to sit and then stand. This can reduce dizziness. SEEK IMMEDIATE MEDICAL CARE IF:   You have a severe headache.  You have unusual pain in the chest, abdomen, or back.  You are bleeding from your mouth or rectum, or you have black or tarry stool.  You have an irregular or very fast heartbeat.  You have pain with breathing.  You have repeated fainting or seizure-like jerking during an episode.  You faint when sitting or lying down.  You have confusion.  You have trouble walking.  You  have severe weakness.  You have vision problems. If you fainted, call your local emergency services (911 in [place] Do not drive yourself to the hospital.    This information is not intended to replace advice given to you by your health care provider. Make sure you discuss any questions you have with your health care provider.   Document Released:  02/02/2005 Document Revised: 06/19/2014 Document Reviewed: 04/03/2011 Elsevier Interactive Patient Education 2016 Elsevier Inc.  Weakness Weakness is a lack of strength. It may be felt all over the body (generalized) or in one specific part of the body (focal). Some causes of weakness can be serious. You may need further medical evaluation, especially if you are elderly or you have a history of immunosuppression (such as chemotherapy or HIV), kidney disease, heart disease, or diabetes. CAUSES  Weakness can be caused by many different things, including:  Infection.  Physical exhaustion.  Internal bleeding or other blood loss that results in a lack of red blood cells (anemia).  Dehydration. This cause is more common in elderly people.  Side effects or electrolyte abnormalities from medicines, such as pain medicines or sedatives.  Emotional distress, anxiety, or depression.  Circulation problems, especially severe peripheral arterial disease.  Heart disease, such as rapid atrial fibrillation, bradycardia, or heart failure.  Nervous system disorders, such as Guillain-Barr syndrome, multiple sclerosis, or stroke. DIAGNOSIS  To find the cause of your weakness, your caregiver will take your history and perform a physical exam. Lab tests or X-rays may also be ordered, if needed. TREATMENT  Treatment of weakness depends on the cause of your symptoms and can vary greatly. HOME CARE INSTRUCTIONS   Rest as needed.  Eat a well-balanced diet.  Try to get some exercise every day.  Only take over-the-counter or prescription medicines as directed by your caregiver. SEEK MEDICAL CARE IF:   Your weakness seems to be getting worse or spreads to other parts of your body.  You develop new aches or pains. SEEK IMMEDIATE MEDICAL CARE IF:   You cannot perform your normal daily activities, such as getting dressed and feeding yourself.  You cannot walk up and down stairs, or you feel exhausted  when you do so.  You have shortness of breath or chest pain.  You have difficulty moving parts of your body.  You have weakness in only one area of the body or on only one side of the body.  You have a fever.  You have trouble speaking or swallowing.  You cannot control your bladder or bowel movements.  You have black or bloody vomit or stools. MAKE SURE YOU:  Understand these instructions.  Will watch your condition.  Will get help right away if you are not doing well or get worse.   This information is not intended to replace advice given to you by your health care provider. Make sure you discuss any questions you have with your health care provider.   Document Released: 02/02/2005 Document Revised: 08/04/2011 Document Reviewed: 04/03/2011 Elsevier Interactive Patient Education 2016 Elsevier Inc.  Acute Bronchitis Bronchitis is inflammation of the airways that extend from the windpipe into the lungs (bronchi). The inflammation often causes mucus to develop. This leads to a cough, which is the most common symptom of bronchitis.  In acute bronchitis, the condition usually develops suddenly and goes away over time, usually in a couple weeks. Smoking, allergies, and asthma can make bronchitis worse. Repeated episodes of bronchitis may cause further lung problems.  CAUSES Acute bronchitis  is most often caused by the same virus that causes a cold. The virus can spread from person to person (contagious) through coughing, sneezing, and touching contaminated objects. SIGNS AND SYMPTOMS   Cough.   Fever.   Coughing up mucus.   Body aches.   Chest congestion.   Chills.   Shortness of breath.   Sore throat.  DIAGNOSIS  Acute bronchitis is usually diagnosed through a physical exam. Your health care provider will also ask you questions about your medical history. Tests, such as chest X-rays, are sometimes done to rule out other conditions.  TREATMENT  Acute  bronchitis usually goes away in a couple weeks. Oftentimes, no medical treatment is necessary. Medicines are sometimes given for relief of fever or cough. Antibiotic medicines are usually not needed but may be prescribed in certain situations. In some cases, an inhaler may be recommended to help reduce shortness of breath and control the cough. A cool mist vaporizer may also be used to help thin bronchial secretions and make it easier to clear the chest.  HOME CARE INSTRUCTIONS  Get plenty of rest.   Drink enough fluids to keep your urine clear or pale yellow (unless you have a medical condition that requires fluid restriction). Increasing fluids may help thin your respiratory secretions (sputum) and reduce chest congestion, and it will prevent dehydration.   Take medicines only as directed by your health care provider.  If you were prescribed an antibiotic medicine, finish it all even if you start to feel better.  Avoid smoking and secondhand smoke. Exposure to cigarette smoke or irritating chemicals will make bronchitis worse. If you are a smoker, consider using nicotine gum or skin patches to help control withdrawal symptoms. Quitting smoking will help your lungs heal faster.   Reduce the chances of another bout of acute bronchitis by washing your hands frequently, avoiding people with cold symptoms, and trying not to touch your hands to your mouth, nose, or eyes.   Keep all follow-up visits as directed by your health care provider.  SEEK MEDICAL CARE IF: Your symptoms do not improve after 1 week of treatment.  SEEK IMMEDIATE MEDICAL CARE IF:  You develop an increased fever or chills.   You have chest pain.   You have severe shortness of breath.  You have bloody sputum.   You develop dehydration.  You faint or repeatedly feel like you are going to pass out.  You develop repeated vomiting.  You develop a severe headache. MAKE SURE YOU:   Understand these  instructions.  Will watch your condition.  Will get help right away if you are not doing well or get worse.   This information is not intended to replace advice given to you by your health care provider. Make sure you discuss any questions you have with your health care provider.   Document Released: 03/12/2004 Document Revised: 02/23/2014 Document Reviewed: 07/26/2012 Elsevier Interactive Patient Education [employer].

## 2015-04-16 ENCOUNTER — Telehealth: Payer: Self-pay | Admitting: *Deleted

## 2015-04-16 NOTE — Telephone Encounter (Signed)
Patient was notified.

## 2015-04-16 NOTE — Telephone Encounter (Signed)
Patient was in ER 04/12/2015 for loss of consciousness. Patient was dehydrated and instructed to get plenty of fluids. Patient has question about whether she should be taking the hctz while she is dehydrated? Please advise?

## 2015-04-16 NOTE — Telephone Encounter (Signed)
If she is eating and drinking well she can stay n

## 2015-04-16 NOTE — Telephone Encounter (Signed)
She can stay on hctz if she is eating and drinking well. If she is not drinking plenty of fluids, or if she is sick on stomach then she should not take it until she feels better.

## 2015-04-16 NOTE — Telephone Encounter (Signed)
LMOVM for pt to return call 

## 2015-05-23 DIAGNOSIS — J301 Allergic rhinitis due to pollen: Secondary | ICD-10-CM | POA: Diagnosis not present

## 2015-05-23 DIAGNOSIS — H1045 Other chronic allergic conjunctivitis: Secondary | ICD-10-CM | POA: Diagnosis not present

## 2015-05-23 DIAGNOSIS — Z91018 Allergy to other foods: Secondary | ICD-10-CM | POA: Diagnosis not present

## 2015-06-05 ENCOUNTER — Other Ambulatory Visit: Payer: Self-pay | Admitting: Family Medicine

## 2015-06-10 DIAGNOSIS — J301 Allergic rhinitis due to pollen: Secondary | ICD-10-CM | POA: Diagnosis not present

## 2015-06-10 DIAGNOSIS — H7112 Cholesteatoma of tympanum, left ear: Secondary | ICD-10-CM | POA: Diagnosis not present

## 2015-06-28 ENCOUNTER — Telehealth: Payer: Self-pay

## 2015-06-28 ENCOUNTER — Ambulatory Visit: Payer: Medicare Other | Admitting: Family Medicine

## 2015-06-28 NOTE — Telephone Encounter (Signed)
Patient's husband called and requesting that something be called in to help with patient's dizziness. He reports that the patient has been dizzy for the last 2 days, and reports that her symptoms are worse with movement. He reports that the patient denies nausea, vomiting, or syncope. Denies any injury. Patient reports that they were supposed to go out of town tomorrow, but they cancelled. He also mentions that they had an appt scheduled today, but is unable to come in because the patient's symptoms will worsen in the car. Will you be willing to call in something for patient? Patient uses [facility] Please advise. Thanks!

## 2015-06-28 NOTE — Telephone Encounter (Signed)
Not willing to treat this without being seen.

## 2015-06-28 NOTE — Telephone Encounter (Signed)
Also discussed below with Grace Bushy, PA and she recommends that patient try OTC dramamine and Flonase to help with symptoms. Advised to call to the office and schedule an appt if this does not improve. Patient and husband verbally understands and agrees to treatment plan.

## 2015-07-08 ENCOUNTER — Other Ambulatory Visit: Payer: Self-pay | Admitting: Family Medicine

## 2015-07-08 NOTE — Telephone Encounter (Signed)
Please call in lorazepam.  

## 2015-07-08 NOTE — Telephone Encounter (Signed)
Rx called in to pharmacy. 

## 2015-07-31 DIAGNOSIS — H5203 Hypermetropia, bilateral: Secondary | ICD-10-CM | POA: Diagnosis not present

## 2015-07-31 DIAGNOSIS — H524 Presbyopia: Secondary | ICD-10-CM | POA: Diagnosis not present

## 2015-07-31 DIAGNOSIS — H52223 Regular astigmatism, bilateral: Secondary | ICD-10-CM | POA: Diagnosis not present

## 2015-08-26 ENCOUNTER — Telehealth: Payer: Self-pay | Admitting: Family Medicine

## 2015-08-26 MED ORDER — TEMAZEPAM 15 MG PO CAPS: ORAL_CAPSULE | ORAL | Status: DC

## 2015-08-26 NOTE — Telephone Encounter (Signed)
Pt says she only uses this when she travels.  She has not needed a refill in two years.   Thanks,   -Mickel Baas

## 2015-08-26 NOTE — Telephone Encounter (Signed)
Patient needs a refill on Temazepam 30mg , take 1/2 to 1 capsule at bedtime as needed.  Patient states that she is out of this medication and will be going out of town early in the morning and will need to pick this up today if possible.  Patient uses [facility] on [address].  Please call when ready.

## 2015-08-26 NOTE — Telephone Encounter (Signed)
Please call in temazepam 

## 2015-09-03 ENCOUNTER — Encounter: Payer: Self-pay | Admitting: Family Medicine

## 2015-09-03 ENCOUNTER — Ambulatory Visit (INDEPENDENT_AMBULATORY_CARE_PROVIDER_SITE_OTHER): Payer: Medicare Other | Admitting: Family Medicine

## 2015-09-03 VITALS — BP 110/70 | HR 65 | Temp 97.9°F | Resp 16 | Ht 63.0 in | Wt 121.0 lb

## 2015-09-03 DIAGNOSIS — B029 Zoster without complications: Secondary | ICD-10-CM | POA: Diagnosis not present

## 2015-09-03 DIAGNOSIS — R5383 Other fatigue: Secondary | ICD-10-CM | POA: Diagnosis not present

## 2015-09-03 MED ORDER — CYANOCOBALAMIN 1000 MCG/ML IJ SOLN
1000.0000 ug | Freq: Once | INTRAMUSCULAR | Status: AC
  Administered 2015-09-03: 1000 ug via INTRAMUSCULAR

## 2015-09-03 MED ORDER — VALACYCLOVIR HCL 1 G PO TABS: 1000.0000 mg | ORAL_TABLET | Freq: Three times a day (TID) | ORAL | Status: AC

## 2015-09-03 NOTE — Progress Notes (Signed)
Patient: Shannon Pennington Female    DOB: 01-27-1941   75 y.o.   MRN: [id number] Visit Date: 09/03/2015  Today's Provider: Lelon Huh, MD   Chief Complaint  Patient presents with  . Rash   Subjective:    Rash This is a new problem. The current episode started yesterday. The problem has been gradually worsening since onset. The affected locations include the chest and back. The rash is characterized by redness and itchiness (stinging). It is unknown if there was an exposure to a precipitant. Associated symptoms include congestion and fatigue. Pertinent negatives include no anorexia, cough, diarrhea, eye pain, facial edema, fever, joint pain, nail changes, rhinorrhea, shortness of breath, sore throat or vomiting. Past treatments include nothing. There is no history of allergies, asthma, eczema or varicella.     Noticed rash on her chest last night and also noticed rash on her back, between her shoulder blades. Rash is red, itchy and stings. Patient states that she has not come into contact with anything new.     Allergies  Allergen Reactions  . Sulfa Antibiotics Other (See Comments)    G.I upset   Current Meds  Medication Sig  . atorvastatin (LIPITOR) 10 MG tablet Take 1 tablet by mouth  every evening  . azelastine (ASTELIN) 0.1 % nasal spray Place 2 sprays into both nostrils every 12 (twelve) hours as needed.  Marland Kitchen estradiol (ESTRACE) 1 MG tablet Take 1 mg by mouth daily.  . fluticasone (FLONASE) 50 MCG/ACT nasal spray USE 1-2 SPRAYS IN EACH NOSTRIL ONCE A DAY  . hydrochlorothiazide (HYDRODIURIL) 25 MG tablet TAKE 1 TABLET BY MOUTH DAILY  . lidocaine (XYLOCAINE) 2 % solution SWISH AND SPIT 1 TO 2 TEASPOONSFUL BY MOUTH 3 TO 4 TIMES A DAY  . LORazepam (ATIVAN) 0.5 MG tablet take 1 tablet by mouth once daily  . medroxyPROGESTERone (PROVERA) 2.5 MG tablet Take 2.5 mg by mouth daily.  . metoprolol succinate (TOPROL-XL) 25 MG 24 hr tablet take 1/2 tablet by mouth once daily  .  temazepam (RESTORIL) 15 MG capsule 1/2 to 1 tablet at bedtime as needed.    Review of Systems  Constitutional: Positive for fatigue. Negative for fever, chills and appetite change.  HENT: Positive for congestion. Negative for rhinorrhea and sore throat.   Eyes: Negative for pain.  Respiratory: Negative for cough, chest tightness and shortness of breath.   Cardiovascular: Negative for chest pain and palpitations.  Gastrointestinal: Negative for nausea, vomiting, abdominal pain, diarrhea and anorexia.  Musculoskeletal: Negative for joint pain.  Skin: Positive for rash. Negative for nail changes.  Neurological: Negative for dizziness and weakness.    Social History  Substance Use Topics  . Smoking status: Former Smoker    Quit date: 02/16/1990  . Smokeless tobacco: Not on file  . Alcohol Use: No   Objective:   BP 110/70 mmHg  Pulse 65  Temp(Src) 97.9 F (36.6 C) (Oral)  Resp 16  Ht 5\' 3"  (1.6 m)  Wt 121 lb (54.885 kg)  BMI 21.44 kg/m2  SpO2 99%  Physical Exam  Skin: Patch of erythema just left of midline at T3 dermatome level of anterior chest, sharply demarcated at midline and with tiny vesicular lesions. Similar area near same midline on back.     Assessment & Plan:     1. Shingles rash Likely shingles, although unusual to have separate patches on front and back. Will cover with valacyclovir  - valACYclovir (VALTREX) 1000 MG tablet;  Take 1 tablet (1,000 mg total) by mouth every 8 (eight) hours.  Dispense: 21 tablet; Refill: 0  2. Other fatigue She requests b12 shot today - cyanocobalamin ((VITAMIN B-12)) injection 1,000 mcg; Inject 1 mL (1,000 mcg total) into the muscle once.  Call if symptoms change or if not rapidly improving.           Lelon Huh, MD  [facility]

## 2015-09-06 ENCOUNTER — Telehealth: Payer: Self-pay

## 2015-09-06 ENCOUNTER — Encounter: Payer: Self-pay | Admitting: Family Medicine

## 2015-09-06 ENCOUNTER — Ambulatory Visit (INDEPENDENT_AMBULATORY_CARE_PROVIDER_SITE_OTHER): Payer: Medicare Other | Admitting: Family Medicine

## 2015-09-06 VITALS — BP 140/80 | HR 72 | Temp 97.8°F | Resp 16

## 2015-09-06 DIAGNOSIS — B029 Zoster without complications: Secondary | ICD-10-CM | POA: Diagnosis not present

## 2015-09-06 DIAGNOSIS — M792 Neuralgia and neuritis, unspecified: Secondary | ICD-10-CM | POA: Diagnosis not present

## 2015-09-06 MED ORDER — AMITRIPTYLINE HCL 10 MG PO TABS: 10.0000 mg | ORAL_TABLET | Freq: Three times a day (TID) | ORAL | Status: DC | PRN

## 2015-09-06 NOTE — Progress Notes (Signed)
Patient: Shannon Pennington Female    DO: [birth year]   75 y.o.   MRM: [id number] Visit Date: 09/06/2015  Today's Provider: Lelon Huh, MD   Chief Complaint  Patient presents with  . Jaw Pain   Subjective:    HPI Ear Pain  Patient was seen office 09/03/2015 for shingles and Started valacyclovir 1000 mg every 8 hours. At that time rash was confined to small patch on chest and back just to her left of midline. Rash has since extended around left side and into left antero-lateral upper arm c/w T1 distribution, but is no longer having any pain at the site of rash. Yesterday patient developed a pain in her left ear that radiates down the left side of her jaw. Pain is intermittent and severe. Patient has taken ibuprofen for the pain with mild relief. Patient stopped taking valacyclovir yesterday, because thought medication could be causing pain. She felt congested in her ear today and took a Mucinex-D, and within an hour the pain had completely resolved.     Allergies  Allergen Reactions  . Sulfa Antibiotics Other (See Comments)    G.I upset   Current Meds  Medication Sig  . atorvastatin (LIPITOR) 10 MG tablet Take 1 tablet by mouth  every evening  . azelastine (ASTELIN) 0.1 % nasal spray Place 2 sprays into both nostrils every 12 (twelve) hours as needed.  Marland Kitchen estradiol (ESTRACE) 1 MG tablet Take 1 mg by mouth daily.  . fluticasone (FLONASE) 50 MCG/ACT nasal spray USE 1-2 SPRAYS IN EACH NOSTRIL ONCE A DAY  . hydrochlorothiazide (HYDRODIURIL) 25 MG tablet TAKE 1 TABLET BY MOUTH DAILY  . lidocaine (XYLOCAINE) 2 % solution SWISH AND SPIT 1 TO 2 TEASPOONSFUL BY MOUTH 3 TO 4 TIMES A DAY  . LORazepam (ATIVAN) 0.5 MG tablet take 1 tablet by mouth once daily  . medroxyPROGESTERone (PROVERA) 2.5 MG tablet Take 2.5 mg by mouth daily.  . metoprolol succinate (TOPROL-XL) 25 MG 24 hr tablet take 1/2 tablet by mouth once daily  . temazepam (RESTORIL) 15 MG capsule 1/2 to 1 tablet at bedtime as  needed.    Review of Systems  Constitutional: Positive for fatigue. Negative for fever, chills and appetite change.  Respiratory: Negative for chest tightness and shortness of breath.   Cardiovascular: Negative for chest pain and palpitations.  Gastrointestinal: Negative for nausea, vomiting and abdominal pain.  Neurological: Positive for weakness and headaches. Negative for dizziness.    Social History  Substance Use Topics  . Smoking status: Former Smoker    Quit date: 02/16/1990  . Smokeless tobacco: Not on file  . Alcohol Use: No   Objective:   BP 140/80 mmHg  Pulse 72  Temp(Src) 97.8 F (36.6 C) (Oral)  Resp 16  SpO2 98%  Physical Exam  General Appearance:    Alert, cooperative, no distress  HENT:   ENT exam normal, no neck nodes or sinus tenderness and post nasal drip noted  Eyes:    PERRL, conjunctiva/corneas clear, EOM's intact       Lungs:     Clear to auscultation bilaterally, respirations unlabored  Heart:    Regular rate and rhythm  Neurologic:   Awake, alert, oriented x 3. No apparent focal neurological           defect.   Skin:   Extensive vesicular eruption on erythematous base across entire T1 dermatome.        Assessment & Plan:  1. 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 I doubt this is directly related to shingles as it involves completely different dermatomal distrubution. However pain has completely resolved since taking Mucinex D, which is usually. If pain returns she was given rx for low dose amitriptyline.   2. Shingles rash More classic features today, I doubt valcyclovir is related to her neck and ear pain, and she will start back on medication today.        Lelon Huh, MD  [facility]

## 2015-09-06 NOTE — Telephone Encounter (Signed)
Patients husband called stating patient has been having a deep throbbing pain near her left ear that radiates down into her jaw (the pain was in her temporal area, but now is more towards her ear).  This pain started yesterday morning and has been constant, occuring every 10-20 seconds. Patient has tried taking Ibuprofen 200mg  (2 tablets at a time) with no relief. Patient stopped taking Valacyclovir yesterday at 3:30pm because they thought it may have been the cause of her pain.  Patient denies numbness, tingling, shortness of breath, blurred vision or slurred speech. Patient was unable to sleep last night to to the constant pain so she feels very fatigued and tired.  Appointment has been scheduled today at 1:30pm.

## 2015-09-16 ENCOUNTER — Other Ambulatory Visit: Payer: Self-pay

## 2015-09-16 MED ORDER — AMITRIPTYLINE HCL 10 MG PO TABS: 10.0000 mg | ORAL_TABLET | Freq: Three times a day (TID) | ORAL | 5 refills | Status: DC | PRN

## 2015-09-16 NOTE — Telephone Encounter (Signed)
Patient is requesting refill. Normally sees Dr. Caryn Section.

## 2015-09-20 ENCOUNTER — Other Ambulatory Visit: Payer: Self-pay | Admitting: Family Medicine

## 2015-09-20 DIAGNOSIS — I10 Essential (primary) hypertension: Secondary | ICD-10-CM

## 2015-09-26 ENCOUNTER — Telehealth: Payer: Self-pay | Admitting: Family Medicine

## 2015-09-26 NOTE — Telephone Encounter (Signed)
Patient states that she is still taking amitriptyline (ELAVIL) 10 MG tablet and is itching really bad for the last 3 days.  She states that she has been on this medication since 09/03/2015.  She was wanting to know if the medication may cause her to start itching.  She has tried Benadryl and it does not help at all with the itching.  She would like to know if she can take more Benadryl or if you can give her something to help her with the itching.  She states that the itching is waking her up at night.  Please advise.

## 2015-09-26 NOTE — Telephone Encounter (Signed)
Tried contacting pt, line is busy. Will try again later.

## 2015-09-26 NOTE — Telephone Encounter (Signed)
Spoke with pt's husband. Medication is helping with pain. However pt would like to get an rx for anti-itch medication.

## 2015-09-26 NOTE — Telephone Encounter (Signed)
Is the medication helping with the pain? If so we can get rx for anti-itch medicine, if not we should change to something else.

## 2015-09-26 NOTE — Telephone Encounter (Signed)
Per Dr. Rosanna Randy advised pt to take Benadryl 25 mg q4hs. Also advised pt to take loratadine 10 mg. Advised pt to call back in the morning to let us know how she is doing.

## 2015-09-27 NOTE — Telephone Encounter (Signed)
Pt advised.   Thanks,   -Rowan Blaker  

## 2015-09-27 NOTE — Telephone Encounter (Signed)
She can apply benadryl cream to itchy areas. It is normal to have itching while rash is healing.

## 2015-09-27 NOTE — Telephone Encounter (Signed)
Pt has been using the benadryl but it's not helping.  She is only itching where the shingles are but she said is is itching terribly.  [facility] [address]  Please call and advise [phone].  Thanks [employer]

## 2015-10-09 ENCOUNTER — Encounter: Payer: Medicare Other | Admitting: Family Medicine

## 2015-12-05 ENCOUNTER — Other Ambulatory Visit: Payer: Self-pay | Admitting: Family Medicine

## 2015-12-05 NOTE — Telephone Encounter (Signed)
Rx called in to pharmacy. 

## 2015-12-05 NOTE — Telephone Encounter (Signed)
Please call in lorazepam.  

## 2015-12-17 DIAGNOSIS — C4491 Basal cell carcinoma of skin, unspecified: Secondary | ICD-10-CM

## 2015-12-17 HISTORY — DX: Basal cell carcinoma of skin, unspecified: C44.91

## 2015-12-30 ENCOUNTER — Other Ambulatory Visit: Payer: Self-pay | Admitting: Family Medicine

## 2016-04-21 ENCOUNTER — Other Ambulatory Visit: Payer: Self-pay | Admitting: Family Medicine

## 2016-04-21 NOTE — Telephone Encounter (Signed)
Please call in temazepam 

## 2016-04-21 NOTE — Telephone Encounter (Signed)
Called into [facility]

## 2016-05-05 ENCOUNTER — Other Ambulatory Visit: Payer: Self-pay | Admitting: Family Medicine

## 2016-05-07 NOTE — Telephone Encounter (Signed)
Please call in lorazepam.  

## 2016-05-07 NOTE — Telephone Encounter (Signed)
Rx called in to pharmacy. 

## 2016-07-28 ENCOUNTER — Other Ambulatory Visit: Payer: Self-pay

## 2016-07-30 ENCOUNTER — Telehealth: Payer: Self-pay | Admitting: Family Medicine

## 2016-08-25 ENCOUNTER — Telehealth: Payer: Self-pay | Admitting: Family Medicine

## 2016-08-31 ENCOUNTER — Telehealth: Payer: Self-pay | Admitting: Family Medicine

## 2016-08-31 NOTE — Telephone Encounter (Signed)
She is now patient of Dr. Ginette Pitman

## 2016-09-11 ENCOUNTER — Ambulatory Visit (INDEPENDENT_AMBULATORY_CARE_PROVIDER_SITE_OTHER): Payer: Medicare Other | Admitting: Urology

## 2016-09-11 ENCOUNTER — Encounter: Payer: Self-pay | Admitting: Urology

## 2016-09-11 VITALS — BP 132/66 | HR 60 | Ht 63.0 in | Wt 121.0 lb

## 2016-09-11 DIAGNOSIS — N390 Urinary tract infection, site not specified: Secondary | ICD-10-CM | POA: Diagnosis not present

## 2016-09-11 DIAGNOSIS — R350 Frequency of micturition: Secondary | ICD-10-CM | POA: Diagnosis not present

## 2016-09-11 LAB — URINALYSIS, COMPLETE
BILIRUBIN UA: NEGATIVE
GLUCOSE, UA: NEGATIVE
KETONES UA: NEGATIVE
LEUKOCYTES UA: NEGATIVE
NITRITE UA: NEGATIVE
Protein, UA: NEGATIVE
SPEC GRAV UA: 1.01 (ref 1.005–1.030)
Urobilinogen, Ur: 0.2 mg/dL (ref 0.2–1.0)
pH, UA: 7.5 (ref 5.0–7.5)

## 2016-09-11 LAB — BLADDER SCAN AMB NON-IMAGING

## 2016-09-11 LAB — MICROSCOPIC EXAMINATION

## 2016-09-11 NOTE — Progress Notes (Signed)
09/11/2016 11:30 AM   Shannon Pennington 05-11-1940 [id number]  Referring provider: Birdie Sons, MD [address] [place], [place] [address]  Chief Complaint  Patient presents with  . Recurrent UTI    New Patient    HPI: 76 year old female who presents today for further evaluation of recurrent urinary tract infections. Over the past year, she's had at least 4 documented UTIs and 11/2015, 03/2016, 04/2016 and 06/2016.  She has grown Escherichia coli and Klebsiella.  She also self treated in Jan 2018 and Mar 26 2016 with cipro stopping after 1-2 doses once her symptoms started to improve.    Overall, the frequency of infections has increased.  She does have a personal history of chronic constipation until 1-2 months ago when she starting on a high fiber diet with her husband..   She has been taking a probiotic over the past 1-2 months. Additionally, she started taking 2 Carberry tablets in the morning daily along with vitamin C.  She does still have her uterus. She is postmenopausal on hormone replacement therapy. She is not sexually active.  At baseline, she does have some urinary urgency and frequency but rare incontinence.    No history of DM.   No personal history of kidney stones.  No flank pain or fevers associated with infection.  No gross hematuria.     PMH: Past Medical History:  Diagnosis Date  . Anxiety   . GERD (gastroesophageal reflux disease)   . Hyperlipidemia   . Hypertension   . Insomnia   . Osteopenia   . Tachycardia     Surgical History: Past Surgical History:  Procedure Laterality Date  . MELANOMA EXCISION Left 05/2009   [facility]  . TONSILLECTOMY      Home Medications:  Allergies as of 09/11/2016      Reactions   Sulfa Antibiotics Other (See Comments)   G.I upset      Medication List       Accurate as of 09/11/16 11:30 AM. Always use your most recent med list.          amitriptyline 10 MG tablet Commonly  known as:  ELAVIL Take 1 tablet (10 mg total) by mouth every 8 (eight) hours as needed (pain).   atorvastatin 10 MG tablet Commonly known as:  LIPITOR TAKE 1 TABLET BY MOUTH  EVERY EVENING   azelastine 0.1 % nasal spray Commonly known as:  ASTELIN Place 2 sprays into both nostrils every 12 (twelve) hours as needed.   estradiol 1 MG tablet Commonly known as:  ESTRACE Take 1 mg by mouth daily.   fluticasone 50 MCG/ACT nasal spray Commonly known as:  FLONASE USE 1-2 SPRAYS IN EACH NOSTRIL ONCE A DAY   hydrochlorothiazide 25 MG tablet Commonly known as:  HYDRODIURIL take 1 tablet by mouth daily   lidocaine 2 % solution Commonly known as:  XYLOCAINE SWISH AND SPIT 1 TO 2 TEASPOONSFUL BY MOUTH 3 TO 4 TIMES A DAY   LORazepam 0.5 MG tablet Commonly known as:  ATIVAN take 1 tablet by mouth once daily   medroxyPROGESTERone 2.5 MG tablet Commonly known as:  PROVERA Take 2.5 mg by mouth daily.   metoprolol succinate 25 MG 24 hr tablet Commonly known as:  TOPROL-XL take 1/2 tablet by mouth once daily   temazepam 15 MG capsule Commonly known as:  RESTORIL take 1/2 to 1 capsule by mouth at bedtime if needed       Allergies:  Allergies  Allergen Reactions  . Sulfa Antibiotics Other (See Comments)    G.I upset    Family History: Family History  Problem Relation Age of Onset  . Cancer Father        lung  . Cancer Brother        lung  . Bladder Cancer Neg Hx   . Prostate cancer Neg Hx   . Kidney cancer Neg Hx     Social History:  reports that she quit smoking about 26 years ago. She has never used smokeless tobacco. She reports that she does not drink alcohol or use drugs.  ROS: UROLOGY Frequent Urination?: Yes Hard to postpone urination?: No Burning/pain with urination?: Yes Get up at night to urinate?: Yes Leakage of urine?: No Urine stream starts and stops?: No Trouble starting stream?: No Do you have to strain to urinate?: No Blood in urine?: No Urinary  tract infection?: Yes Sexually transmitted disease?: No Injury to kidneys or bladder?: No Painful intercourse?: No Weak stream?: No Currently pregnant?: No Vaginal bleeding?: No Last menstrual period?: n  Gastrointestinal Nausea?: No Vomiting?: No Indigestion/heartburn?: Yes Diarrhea?: No Constipation?: Yes  Constitutional Fever: No Night sweats?: No Weight loss?: No Fatigue?: Yes  Skin Skin rash/lesions?: No Itching?: No  Eyes Blurred vision?: No Double vision?: No  Ears/Nose/Throat Sore throat?: No Sinus problems?: Yes  Hematologic/Lymphatic Swollen glands?: No Easy bruising?: No  Cardiovascular Leg swelling?: No Chest pain?: No  Respiratory Cough?: No Shortness of breath?: No  Endocrine Excessive thirst?: No  Musculoskeletal Back pain?: No Joint pain?: No  Neurological Headaches?: Yes Dizziness?: No  Psychologic Depression?: No Anxiety?: No  Physical Exam: BP 132/66   Pulse 60   Ht 5\' 3"  (1.6 m)   Wt 121 lb (54.9 kg)   BMI 21.43 kg/m   Constitutional:  Alert and oriented, No acute distress.  Accompanied by husband today.  Well dressed.   HEENT: [place] AT, moist mucus membranes.  Trachea midline, no masses. Cardiovascular: No clubbing, cyanosis, or edema. Respiratory: Normal respiratory effort, no increased work of breathing. GI: Abdomen is soft, nontender, nondistended, no abdominal masses GU: No CVA tenderness.  Skin: No rashes, bruises or suspicious lesions. Neurologic: Grossly intact, no focal deficits, moving all 4 extremities. Psychiatric: Normal mood and affect.  Laboratory Data: Lab Results  Component Value Date   WBC 4.1 04/12/2015   HGB 14.6 04/12/2015   HCT 42.3 04/12/2015   MCV 88.7 04/12/2015   PLT 174 04/12/2015   Cr 1.1 on 9/17  Urinalysis Results for orders placed or performed in visit on 09/11/16  Microscopic Examination  Result Value Ref Range   WBC, UA 0-5 0 - 5 /hpf   Epithelial Cells (non renal) 0-10 0 -  10 /hpf  Urinalysis, Complete  Result Value Ref Range   Specific Gravity, UA 1.010 1.005 - 1.030   pH, UA 7.5 5.0 - 7.5   Color, UA Yellow Yellow   Appearance Ur Clear Clear   Leukocytes, UA Negative Negative   Protein, UA Negative Negative/Trace   Glucose, UA Negative Negative   Ketones, UA Negative Negative   RBC, UA Trace (A) Negative   Bilirubin, UA Negative Negative   Urobilinogen, Ur 0.2 0.2 - 1.0 mg/dL   Nitrite, UA Negative Negative   Microscopic Examination See below:   BLADDER SCAN AMB NON-IMAGING  Result Value Ref Range   Scan Result 76ml     Pertinent Imaging: PVR 0  Assessment & Plan:    1. Recurrent  UTI Recurrent urinary tract infections likely related to lower urinary tract source Renal ultrasound ordered today to rule out upper tract etiologies Behavioral modification was discussed at length today including signs and symptoms of urinary tract infections, recommendations for urology nurse visits on these occasions, voiding self treatment and shortened robotic courses which can breed bacterial resistance Discuss additional behavioral modification including wiping front to back, addition of probiotic, avoidance of constipation, cranberry tabs twice daily, amongst others. Limited benefit topical estrogen cream given that she is on oral hormone replacement therapy at this time, we'll consider starting topicals when she discontinues these Pelvic exam next visit to rule out lower tract etiologies, consider cystoscopy as needed Prefer to avoid suppressive antibiotics at this time, but if we see a pattern developing [facility]ie[facility] for a short course - Urinalysis, Complete - BLADDER SCAN AMB NON-IMAGING - Ultrasound renal complete; Future  2. Urinary frequency Mild baseline urinary frequency/urgency, not interested in pharmacotherapy at this time  Return in about 4 weeks (around 10/09/2016), or f/u RUS/ pelvic exam/ PVR.  Hollice Espy, MD  [facility] [address] [place], [place] [address] [phone]

## 2016-09-28 ENCOUNTER — Ambulatory Visit
Admission: RE | Admit: 2016-09-28 | Discharge: 2016-09-28 | Disposition: A | Discharge status: Home / Self Care | Payer: Medicare Other | Source: Ambulatory Visit | Attending: Urology | Admitting: Urology

## 2016-09-28 DIAGNOSIS — N390 Urinary tract infection, site not specified: Secondary | ICD-10-CM | POA: Diagnosis present

## 2016-09-28 DIAGNOSIS — N281 Cyst of kidney, acquired: Secondary | ICD-10-CM | POA: Insufficient documentation

## 2016-10-05 ENCOUNTER — Other Ambulatory Visit: Payer: Self-pay | Admitting: Family Medicine

## 2016-10-05 NOTE — Telephone Encounter (Signed)
Please call in lorazepam.  

## 2016-10-06 NOTE — Telephone Encounter (Signed)
Rx called in to pharmacy. 

## 2016-10-09 ENCOUNTER — Ambulatory Visit (INDEPENDENT_AMBULATORY_CARE_PROVIDER_SITE_OTHER): Payer: Medicare Other | Admitting: Urology

## 2016-10-09 ENCOUNTER — Encounter: Payer: Self-pay | Admitting: Urology

## 2016-10-09 VITALS — BP 147/67 | HR 52 | Ht 63.0 in | Wt 123.7 lb

## 2016-10-09 DIAGNOSIS — N362 Urethral caruncle: Secondary | ICD-10-CM

## 2016-10-09 DIAGNOSIS — N390 Urinary tract infection, site not specified: Secondary | ICD-10-CM | POA: Diagnosis not present

## 2016-10-09 DIAGNOSIS — R350 Frequency of micturition: Secondary | ICD-10-CM | POA: Diagnosis not present

## 2016-10-09 LAB — URINALYSIS, COMPLETE
Bilirubin, UA: NEGATIVE
Glucose, UA: NEGATIVE
Ketones, UA: NEGATIVE
LEUKOCYTES UA: NEGATIVE
Nitrite, UA: NEGATIVE
PH UA: 7 (ref 5.0–7.5)
Protein, UA: NEGATIVE
RBC, UA: NEGATIVE
SPEC GRAV UA: 1.015 (ref 1.005–1.030)
Urobilinogen, Ur: 0.2 mg/dL (ref 0.2–1.0)

## 2016-10-09 LAB — MICROSCOPIC EXAMINATION
BACTERIA UA: NONE SEEN
RBC, UA: NONE SEEN /hpf (ref 0–?)
WBC, UA: NONE SEEN /hpf (ref 0–?)

## 2016-10-09 NOTE — Progress Notes (Signed)
10/09/2016 4:05 PM   Shannon Pennington [birth year] [id number]  Referring provider: Tracie Harrier, MD [address] [facility] [place], [place] [address]  No chief complaint on file.   HPI: 76 year old female who presents today for further evaluation of recurrent urinary tract infections.  Please see previous note for details. She returns today for follow-up renal ultrasound as well as pelvic exam.  Since last visit, she is doing extremely well. She has had no further symptomatic urinary tract infections.  She has been taking a probiotic. Additionally, she started taking 2 Carberry tablets in the morning daily along with vitamin C.  She does still have her uterus. She is postmenopausal on hormone replacement therapy. She is not sexually active.  At baseline, she does have some urinary urgency and frequency but rare incontinence.    No history of DM.   No personal history of kidney stones.  No flank pain or fevers associated with infection.  No gross hematuria.     PMH: Past Medical History:  Diagnosis Date  . Anxiety   . GERD (gastroesophageal reflux disease)   . Hyperlipidemia   . Hypertension   . Insomnia   . Osteopenia   . Tachycardia     Surgical History: Past Surgical History:  Procedure Laterality Date  . MELANOMA EXCISION Left 05/2009   [facility]  . TONSILLECTOMY      Home Medications:  Allergies as of 10/09/2016      Reactions   Sulfa Antibiotics Other (See Comments)   G.I upset      Medication List       Accurate as of 10/09/16 11:59 PM. Always use your most recent med list.          atorvastatin 10 MG tablet Commonly known as:  LIPITOR TAKE 1 TABLET BY MOUTH  EVERY EVENING   azelastine 0.1 % nasal spray Commonly known as:  ASTELIN Place 2 sprays into both nostrils every 12 (twelve) hours as needed.   estradiol 1 MG tablet Commonly known as:  ESTRACE Take 1 mg by mouth daily.   fluticasone 50 MCG/ACT  nasal spray Commonly known as:  FLONASE USE 1-2 SPRAYS IN EACH NOSTRIL ONCE A DAY   hydrochlorothiazide 25 MG tablet Commonly known as:  HYDRODIURIL take 1 tablet by mouth daily   LORazepam 0.5 MG tablet Commonly known as:  ATIVAN take 1 tablet by mouth once daily   medroxyPROGESTERone 2.5 MG tablet Commonly known as:  PROVERA Take 2.5 mg by mouth daily.   metoprolol succinate 25 MG 24 hr tablet Commonly known as:  TOPROL-XL take 1/2 tablet by mouth once daily   temazepam 15 MG capsule Commonly known as:  RESTORIL take 1/2 to 1 capsule by mouth at bedtime if needed            Discharge Care Instructions        Start     Ordered   10/09/16 0000  Urinalysis, Complete     10/09/16 0947   10/09/16 0000  Microscopic Examination     10/09/16 0000      Allergies:  Allergies  Allergen Reactions  . Sulfa Antibiotics Other (See Comments)    G.I upset    Family History: Family History  Problem Relation Age of Onset  . Cancer Father        lung  . Cancer Brother        lung  . Bladder Cancer Neg Hx   . Prostate  cancer Neg Hx   . Kidney cancer Neg Hx     Social History:  reports that she quit smoking about 26 years ago. She has never used smokeless tobacco. She reports that she does not drink alcohol or use drugs.  ROS: UROLOGY Frequent Urination?: Yes Hard to postpone urination?: No Burning/pain with urination?: No Get up at night to urinate?: Yes Leakage of urine?: No Urine stream starts and stops?: No Trouble starting stream?: No Do you have to strain to urinate?: No Blood in urine?: No Urinary tract infection?: No Sexually transmitted disease?: No Injury to kidneys or bladder?: No Painful intercourse?: No Weak stream?: No Currently pregnant?: No Vaginal bleeding?: No Last menstrual period?: n  Gastrointestinal Nausea?: No Vomiting?: No Indigestion/heartburn?: No Diarrhea?: No Constipation?: No  Constitutional Fever: No Night sweats?:  No Weight loss?: No Fatigue?: No  Skin Skin rash/lesions?: No Itching?: No  Eyes Blurred vision?: No Double vision?: No  Ears/Nose/Throat Sore throat?: No Sinus problems?: Yes  Hematologic/Lymphatic Swollen glands?: No Easy bruising?: No  Cardiovascular Leg swelling?: No Chest pain?: No  Respiratory Cough?: No Shortness of breath?: No  Endocrine Excessive thirst?: No  Musculoskeletal Back pain?: No Joint pain?: No  Neurological Headaches?: Yes Dizziness?: No  Psychologic Depression?: No Anxiety?: No  Physical Exam: BP (!) 147/67 (BP Location: Left Arm, Patient Position: Sitting, Cuff Size: Normal)   Pulse (!) 52   Ht 5\' 3"  (1.6 m)   Wt 123 lb 11.2 oz (56.1 kg)   BMI 21.91 kg/m   Constitutional:  Alert and oriented, No acute distress.  Accompanied by husband today.  Well dressed.   HEENT: [place] AT, moist mucus membranes.  Trachea midline, no masses. Cardiovascular: No clubbing, cyanosis, or edema. Respiratory: Normal respiratory effort, no increased work of breathing. GI: Abdomen is soft, nontender, nondistended, no abdominal masses GU: Normal external genitalia.  Mild urethral hypermobility but no demonstrable stress urinary incontinence with Valsalva. Small urethral caruncle. Atrophic vaginitis appreciated. Minimal grade 1 cystocele, otherwise fairly decent vaginal support. Skin: No rashes, bruises or suspicious lesions. Neurologic: Grossly intact, no focal deficits, moving all 4 extremities. Psychiatric: Normal mood and affect.  Laboratory Data: Lab Results  Component Value Date   WBC 4.1 04/12/2015   HGB 14.6 04/12/2015   HCT 42.3 04/12/2015   MCV 88.7 04/12/2015   PLT 174 04/12/2015   Cr 1.1 on 9/17  Urinalysis Results for orders placed or performed in visit on 10/09/16  Microscopic Examination  Result Value Ref Range   WBC, UA None seen 0 - 5 /hpf   RBC, UA None seen 0 - 2 /hpf   Epithelial Cells (non renal) 0-10 0 - 10 /hpf   Bacteria,  UA None seen None seen/Few  Urinalysis, Complete  Result Value Ref Range   Specific Gravity, UA 1.015 1.005 - 1.030   pH, UA 7.0 5.0 - 7.5   Color, UA Yellow Yellow   Appearance Ur Cloudy (A) Clear   Leukocytes, UA Negative Negative   Protein, UA Negative Negative/Trace   Glucose, UA Negative Negative   Ketones, UA Negative Negative   RBC, UA Negative Negative   Bilirubin, UA Negative Negative   Urobilinogen, Ur 0.2 0.2 - 1.0 mg/dL   Nitrite, UA Negative Negative   Microscopic Examination See below:     Pertinent Imaging: CLINICAL DATA:  Recurrent urinary tract infections  EXAM: RENAL / URINARY TRACT ULTRASOUND COMPLETE  COMPARISON:  None.  FINDINGS: Right Kidney:  Length: 8.0 cm. Echogenicity within normal limits.  There is mild renal cortical thinning on the right. No mass, perinephric fluid, or hydronephrosis visualized. No sonographically demonstrable calculus or ureterectasis.  Left Kidney:  Length: 9.2 cm. Echogenicity within normal limits. There is mild renal cortical thinning. No perinephric fluid or hydronephrosis visualized. There is a cyst in the lower pole left kidney measuring 0.8 x 0.7 x 0.9 cm. No sonographically demonstrable calculus or ureterectasis.  Bladder:  Appears normal for degree of bladder distention.  IMPRESSION: Mild renal cortical thinning. Small right kidney. Question underlying medical renal disease. Small right kidney could be indicative of a degree of renal artery stenosis. In this regard, question whether patient is hypertensive.  Small left renal cyst. No other mass evident. No obstructing focus in either kidney. Renal echogenicity within normal limits bilaterally.   Electronically Signed   By: Lowella Grip III M.D.   On: 09/28/2016 10:09  Renal ultrasound personally reviewed today.  Assessment & Plan:    1. Recurrent UTI Improving, no infections since last visit on current regimen Continue  probiotics, cranberry tabs, and hygiene Renal ultrasound unremarkable for contributing factor Pelvic exam unremarkable other than for atrophic vaginitis- would recommend topical estrogen cream once she is no longer on oral hormone replacement therapy Advised to come to our office when necessary dysuria for nurse visit UA/urine culture Will consider suppressive antibiotics if absolutely indicated depending on number and severity of infections  - Urinalysis, Complete  2. Urinary frequency Mild baseline urinary frequency/urgency, not interested in pharmacotherapy at this time  3. Urethral caruncle Asymptomatic, no treatment indicated  F/u prn  Hollice Espy, MD  [facility] [address], [place], [place] [address] [phone]

## 2016-11-16 NOTE — Telephone Encounter (Signed)
Not a patient of [facility]

## 2016-11-26 ENCOUNTER — Ambulatory Visit (INDEPENDENT_AMBULATORY_CARE_PROVIDER_SITE_OTHER): Payer: Medicare Other

## 2016-11-26 VITALS — BP 149/77 | HR 61 | Ht 63.0 in | Wt 122.8 lb

## 2016-11-26 DIAGNOSIS — N390 Urinary tract infection, site not specified: Secondary | ICD-10-CM | POA: Diagnosis not present

## 2016-11-26 LAB — MICROSCOPIC EXAMINATION
Bacteria, UA: NONE SEEN
EPITHELIAL CELLS (NON RENAL): NONE SEEN /HPF (ref 0–10)
RBC MICROSCOPIC, UA: NONE SEEN /HPF (ref 0–?)

## 2016-11-26 LAB — URINALYSIS, COMPLETE
BILIRUBIN UA: NEGATIVE
Glucose, UA: NEGATIVE
KETONES UA: NEGATIVE
Nitrite, UA: NEGATIVE
Protein, UA: NEGATIVE
SPEC GRAV UA: 1.01 (ref 1.005–1.030)
UUROB: 0.2 mg/dL (ref 0.2–1.0)
pH, UA: 7 (ref 5.0–7.5)

## 2016-11-26 LAB — BLADDER SCAN AMB NON-IMAGING

## 2016-11-26 NOTE — Progress Notes (Signed)
Patient presents today with c/o UTI sx(s):   Painful Urination Urinary Urgency Pressure  Vitals:   11/26/16 0949  BP: (!) 149/77  Pulse: 61  Weight: 122 lb 12.8 oz (55.7 kg)  Height: 5\' 3"  (1.6 m)     Bladder Scan Patient cannot void: 35 ml Performed By: C. Corinna Capra, CMA  Pt has not been on antibiotics in the last 30 days. Pt is not on suppressive antibiotics. Pt has not had urological surgery in the last 30 days.  Patient unable to void while in office, states she will be in a wedding this weekend and thinks it is her nerves that is causing her to have voiding issues. Patient had another appt, was given urine cup and encouraged to drink fluids, will drop sample off after appt.

## 2016-12-01 ENCOUNTER — Telehealth: Payer: Self-pay | Admitting: Urology

## 2016-12-01 LAB — URINE CULTURE

## 2016-12-01 NOTE — Telephone Encounter (Signed)
-----   Message from Hollice Espy, MD sent at 12/01/2016  3:27 PM EDT ----- Urinalysis is fairly unremarkable but her urine culture did end up growing bacteria, Citrobacter. Lets treat her with Cipro 500 mg twice a day 5 days.  Hollice Espy, MD

## 2016-12-03 ENCOUNTER — Telehealth: Payer: Self-pay | Admitting: Urology

## 2016-12-03 MED ORDER — CIPROFLOXACIN HCL 500 MG PO TABS: 500.0000 mg | ORAL_TABLET | Freq: Two times a day (BID) | ORAL | 0 refills | Status: DC

## 2016-12-03 NOTE — Telephone Encounter (Signed)
Patient's husband called and was asking about a message that he saw on My Chart about his wife needing to start Cipro. He said he never got a call telling them that they needed to do anything or that she had an infection. I looked in her chart and saw your message that she did have an infection and needed to start Cipro, so I read your message to him and he said she already had some cipro 500mg  that you  gave her but Judson Roch will call the patient to make sure they have enough to take it twice a day for 5 days like she needs to. If not she will go ahead and call more in.   Thanks, Shekelia Lull

## 2016-12-03 NOTE — Telephone Encounter (Signed)
Spouse called in with questions about lab results from pt's PCP visit on yesterday. Advised spouse to contact PCP. Spse verbalized understanding.

## 2016-12-03 NOTE — Telephone Encounter (Signed)
Patient's husband notified script sent

## 2016-12-03 NOTE — Addendum Note (Signed)
Addended by: Tommy Rainwater on: 12/03/2016 11:12 AM   Modules accepted: Orders

## 2016-12-05 ENCOUNTER — Encounter: Payer: Self-pay | Admitting: Urology

## 2017-01-09 ENCOUNTER — Other Ambulatory Visit: Payer: Self-pay | Admitting: Family Medicine

## 2017-01-09 NOTE — Telephone Encounter (Signed)
Please advise patient that have sent 90 day prescription to mail order, but has been over a year since last visit and need to schedule o.v. Within the next month.

## 2017-01-12 NOTE — Telephone Encounter (Signed)
Called pt who stated that she is no longer our patient anymore. She now sees Dr. Ginette Pitman at [facility].

## 2017-03-08 ENCOUNTER — Other Ambulatory Visit: Payer: Self-pay | Admitting: Family Medicine

## 2017-04-01 ENCOUNTER — Other Ambulatory Visit: Payer: Self-pay | Admitting: Family Medicine

## 2017-05-20 NOTE — Telephone Encounter (Signed)
complete

## 2017-07-15 ENCOUNTER — Telehealth: Payer: Self-pay | Admitting: Urology

## 2017-07-15 NOTE — Telephone Encounter (Signed)
Spoke with patient , could not see UA results in Epic and could not see if culture was ordered but with UA done yesterday culture results would not be resulted yet. Patient states she is not having symptoms and does not want to take abx unless she has too. It was confirmed that patient should call back when she has symptoms and bring a urine to our office for culture and would base treatment off culture results. Patient verbalized agreement with this plan.

## 2017-07-15 NOTE — Telephone Encounter (Signed)
Pt called office today stating Erlene Quan told her to come here if she thought she had a UTI.  Pt was at Dr. Linton Ham office yesterday and she gave a urine.  They called her today and said she did have a UTI and gave her Cipro, which she has NOT started yet.  She is unsure if they did a U/A or culture or both.  Please give her a call.

## 2017-07-21 ENCOUNTER — Encounter: Payer: Self-pay | Admitting: Obstetrics & Gynecology

## 2017-07-21 ENCOUNTER — Ambulatory Visit (INDEPENDENT_AMBULATORY_CARE_PROVIDER_SITE_OTHER): Payer: Medicare Other | Admitting: Obstetrics & Gynecology

## 2017-07-21 VITALS — BP 130/80 | Ht 64.0 in | Wt 131.0 lb

## 2017-07-21 DIAGNOSIS — R232 Flushing: Secondary | ICD-10-CM

## 2017-07-21 DIAGNOSIS — Z1231 Encounter for screening mammogram for malignant neoplasm of breast: Secondary | ICD-10-CM

## 2017-07-21 DIAGNOSIS — Z0000 Encounter for general adult medical examination without abnormal findings: Secondary | ICD-10-CM | POA: Diagnosis not present

## 2017-07-21 DIAGNOSIS — Z124 Encounter for screening for malignant neoplasm of cervix: Secondary | ICD-10-CM | POA: Diagnosis not present

## 2017-07-21 DIAGNOSIS — Z1239 Encounter for other screening for malignant neoplasm of breast: Secondary | ICD-10-CM

## 2017-07-21 MED ORDER — ESTRADIOL 1 MG PO TABS: 1.0000 mg | ORAL_TABLET | Freq: Every day | ORAL | 11 refills | Status: DC

## 2017-07-21 MED ORDER — MEDROXYPROGESTERONE ACETATE 2.5 MG PO TABS: 2.5000 mg | ORAL_TABLET | Freq: Every day | ORAL | 11 refills | Status: DC

## 2017-07-21 NOTE — Patient Instructions (Signed)

## 2017-07-21 NOTE — Progress Notes (Signed)
HPI:      [name] is a 77 y.o. G1P1001 who LMP was in the past, she presents today for her annual examination.  The patient has no complaints today other than HOT FLASHES that have been helped with HRT that was started in 2017, not sure if on HRT prior to that ever, as menopause was ion her 40s.. The patient is not sexually active. Herlast pap: approximate date 2013 and was normal and last mammogram: approximate date years ago and was normal.  The patient does perform self breast exams.  There is no notable family history of breast or ovarian cancer in her family. The patient is taking hormone replacement therapy. Patient denies post-menopausal vaginal bleeding.   The patient has regular exercise: yes. The patient denies current symptoms of depression.    GYN Hx: Last Colonoscopy:few years ago. Normal.  Last DEXA: never ago.    PMHx: Past Medical History:  Diagnosis Date  . Anxiety   . GERD (gastroesophageal reflux disease)   . Hyperlipidemia   . Hypertension   . Insomnia   . Osteopenia   . Tachycardia   . Thyroid disease    Past Surgical History:  Procedure Laterality Date  . MELANOMA EXCISION Left 05/2009   [facility]  . TONSILLECTOMY     Family History  Problem Relation Age of Onset  . Cancer Father        lung  . Cancer Brother        lung  . Bladder Cancer Neg Hx   . Prostate cancer Neg Hx   . Kidney cancer Neg Hx    Social History   Tobacco Use  . Smoking status: Former Smoker    Last attempt to quit: 02/16/1990    Years since quitting: 27.4  . Smokeless tobacco: Never Used  Substance Use Topics  . Alcohol use: No    Alcohol/week: 0.0 oz  . Drug use: No    Current Outpatient Medications:  .  atorvastatin (LIPITOR) 10 MG tablet, TAKE 1 TABLET BY MOUTH  EVERY EVENING, Disp: 90 tablet, Rfl: 4 .  estradiol (ESTRACE) 1 MG tablet, Take 1 tablet (1 mg total) by mouth daily., Disp: 30 tablet, Rfl: 11 .  hydrochlorothiazide (HYDRODIURIL) 25  MG tablet, take 1 tablet by mouth daily, Disp: 90 tablet, Rfl: 4 .  levocetirizine (XYZAL) 5 MG tablet, Take by mouth., Disp: , Rfl:  .  levothyroxine (SYNTHROID, LEVOTHROID) 25 MCG tablet, Take by mouth., Disp: , Rfl:  .  LORazepam (ATIVAN) 0.5 MG tablet, take 1 tablet by mouth once daily, Disp: 30 tablet, Rfl: 4 .  medroxyPROGESTERone (PROVERA) 2.5 MG tablet, Take 1 tablet (2.5 mg total) by mouth daily., Disp: 30 tablet, Rfl: 11 .  metoprolol succinate (TOPROL-XL) 25 MG 24 hr tablet, take 1/2 tablet by mouth once daily, Disp: 30 tablet, Rfl: 12 .  temazepam (RESTORIL) 15 MG capsule, take 1/2 to 1 capsule by mouth at bedtime if needed, Disp: 30 capsule, Rfl: 3 .  azelastine (ASTELIN) 0.1 % nasal spray, Place 2 sprays into both nostrils every 12 (twelve) hours as needed., Disp: , Rfl:  .  ciprofloxacin (CIPRO) 500 MG tablet, Take 1 tablet (500 mg total) by mouth every 12 (twelve) hours., Disp: 10 tablet, Rfl: 0 .  fluticasone (FLONASE) 50 MCG/ACT nasal spray, USE 1-2 SPRAYS IN EACH NOSTRIL ONCE A DAY, Disp: , Rfl: 3 Allergies: Sulfa antibiotics  Review of Systems  Constitutional: Negative for chills, fever and  malaise/fatigue.  HENT: Negative for congestion, sinus pain and sore throat.   Eyes: Negative for blurred vision and pain.  Respiratory: Negative for cough and wheezing.   Cardiovascular: Negative for chest pain and leg swelling.  Gastrointestinal: Negative for abdominal pain, constipation, diarrhea, heartburn, nausea and vomiting.  Genitourinary: Negative for dysuria, frequency, hematuria and urgency.  Musculoskeletal: Negative for back pain, joint pain, myalgias and neck pain.  Skin: Negative for itching and rash.  Neurological: Negative for dizziness, tremors and weakness.  Endo/Heme/Allergies: Does not bruise/bleed easily.  Psychiatric/Behavioral: Negative for depression. The patient is not nervous/anxious and does not have insomnia.     Objective: BP 130/80   Ht 5\' 4"  (1.626  m)   Wt 131 lb (59.4 kg)   BMI 22.49 kg/m   Filed Weights   07/21/17 1356  Weight: 131 lb (59.4 kg)   Body mass index is 22.49 kg/m. Physical Exam  Constitutional: She is oriented to person, place, and time. She appears well-developed and well-nourished. No distress.  Genitourinary: Rectum normal, vagina normal and uterus normal. Pelvic exam was performed with patient supine. There is no rash or lesion on the right labia. There is no rash or lesion on the left labia. Vagina exhibits no lesion. No bleeding in the vagina. Right adnexum does not display mass and does not display tenderness. Left adnexum does not display mass and does not display tenderness. Cervix does not exhibit motion tenderness, lesion, friability or polyp.   Uterus is mobile and midaxial. Uterus is not enlarged or exhibiting a mass.  HENT:  Head: Normocephalic and atraumatic. Head is without laceration.  Right Ear: Hearing normal.  Left Ear: Hearing normal.  Nose: No epistaxis.  No foreign bodies.  Mouth/Throat: Uvula is midline, oropharynx is clear and moist and mucous membranes are normal.  Eyes: Pupils are equal, round, and reactive to light.  Neck: Normal range of motion. Neck supple. No thyromegaly present.  Cardiovascular: Normal rate and regular rhythm. Exam reveals no gallop and no friction rub.  No murmur heard. Pulmonary/Chest: Effort normal and breath sounds normal. No respiratory distress. She has no wheezes. Right breast exhibits no mass, no skin change and no tenderness. Left breast exhibits no mass, no skin change and no tenderness.  Abdominal: Soft. Bowel sounds are normal. She exhibits no distension. There is no tenderness. There is no rebound.  Musculoskeletal: Normal range of motion.  Neurological: She is alert and oriented to person, place, and time. No cranial nerve deficit.  Skin: Skin is warm and dry.  Psychiatric: She has a normal mood and affect. Judgment normal.  Vitals  reviewed.   Assessment: Annual Exam 1. Annual physical exam   2. Hot flashes   3. Screening for cervical cancer   4. Screening breast examination    Plan:            1.  Cervical Screening-  Pap smear done today  2. Breast screening- Exam annually and mammogram scheduled  3. Colonoscopy every 10 years, Hemoccult testing after age 26  4. Labs managed by PCP  5. Counseling for hormonal therapy: no change in therapy today Desires to continue HRT, risks discussed. Helps w Hot flashes HRT I have discussed HRT with the patient in detail.  The risk/benefits of it were reviewed.  She understands that during menopause Estrogen decreases dramatically and that this results in an increased risk of cardiovascular disease as well as osteoporosis.  We have also discussed the fact that hot flashes often result  from a decrease in Estrogen, and that by replacing Estrogen, they can often be alleviated.  We have discussed skin, vaginal and urinary tract changes that may also take place from this drop in Estrogen.  Emotional changes have also been linked to Estrogen and we have briefly discussed this.  The benefits of HRT including decrease in hot flashes, vaginal dryness, and osteoporosis were discussed.  The emotional benefit and a possible change in her cardiovascular risk profile was also reviewed.  The risks associated with Hormone Replacement Therapy were also reviewed.  The use of unopposed Estrogen and its relationship to endometrial cancer was discussed.  The addition of Progesterone and its beneficial effect on endometrial cancer was also noted.  The fact that there has been no consistent definitive studies showing an increase in breast cancer in women who use HRT was discussed with the patient.  The possible side effects including breast tenderness, fluid retention, mood changes and vaginal bleeding were discussed.  The patient was informed that this is an elective medication and that she may choose not  to take Hormone Replacement Therapy.  Literature on HRT was given, and I believe that after answering all of the patient's questions, she has an adequate and informed understanding of HRT.  Special emphasis on the WHI study, as well as several studies since that pertaining to the risks and benefits of estrogen replacement therapy were compared.  The possible limitations of these studies were discussed including the age stratification of the WHI study.  The possible role of Progesterone in these studies was discussed in detail.  I believe that the patient has an informed knowledge of the risks and benefits of HRT.  I have specifically discussed WHI findings and current updates.  Different type of hormone formulation and methods of taking hormone replacement therapy discussed.      F/U  Return in about 1 year (around 07/22/2018) for Follow up HRT.  Barnett Applebaum, MD, [facility], [facility] 07/21/2017  2:14 PM

## 2017-07-23 ENCOUNTER — Telehealth: Payer: Self-pay

## 2017-07-23 LAB — PAP IG (IMAGE GUIDED): PAP Smear Comment: 0

## 2017-07-23 NOTE — Telephone Encounter (Signed)
Pt husband aware waiting on response from PA

## 2017-07-23 NOTE — Telephone Encounter (Signed)
Mariea Clonts (pt husband) calling to report they p/u the progesterone but the estrogen needed authorization. Please confirm that the fax form from the pharmacy has been received and this is being worked on. [phone]

## 2017-08-02 ENCOUNTER — Encounter: Payer: Self-pay | Admitting: Urology

## 2017-08-02 ENCOUNTER — Ambulatory Visit (INDEPENDENT_AMBULATORY_CARE_PROVIDER_SITE_OTHER): Payer: Medicare Other

## 2017-08-02 VITALS — BP 143/73 | HR 62

## 2017-08-02 DIAGNOSIS — N390 Urinary tract infection, site not specified: Secondary | ICD-10-CM

## 2017-08-02 MED ORDER — CIPROFLOXACIN HCL 500 MG PO TABS: 500.0000 mg | ORAL_TABLET | Freq: Two times a day (BID) | ORAL | 0 refills | Status: DC

## 2017-08-02 NOTE — Progress Notes (Signed)
Patient present today complaining of urinary frequency. UA today does look positive for infection ,per Dr. Erlene Quan treat with Cipro 500mg  BID for 5 days. Will call with culture results

## 2017-08-03 LAB — URINALYSIS, COMPLETE
BILIRUBIN UA: NEGATIVE
GLUCOSE, UA: NEGATIVE
KETONES UA: NEGATIVE
Nitrite, UA: POSITIVE — AB
PROTEIN UA: NEGATIVE
SPEC GRAV UA: 1.01 (ref 1.005–1.030)
Urobilinogen, Ur: 0.2 mg/dL (ref 0.2–1.0)
pH, UA: 6.5 (ref 5.0–7.5)

## 2017-08-03 LAB — MICROSCOPIC EXAMINATION: RBC, UA: NONE SEEN /hpf (ref 0–2)

## 2017-08-04 LAB — CULTURE, URINE COMPREHENSIVE

## 2017-08-11 IMAGING — CT CT HEAD W/O CM
2 series · 14 of 30 positions shown, 16 images · non-contrast
Comparison: CT of the head performed 08/30/2013

CLINICAL DATA: Acute onset of seizure like activity after fall.
Generalized weakness. Initial encounter.

EXAM:
CT HEAD WITHOUT CONTRAST
TECHNIQUE: Contiguous axial images were obtained from the base of the skull
through the vertex without intravenous contrast.

[Series 2: head wo · axial · 0.39mm/px · z∈[-49,+51]mm · 6 of 29 slices shown, 8 images]
[im 5/29  brain]
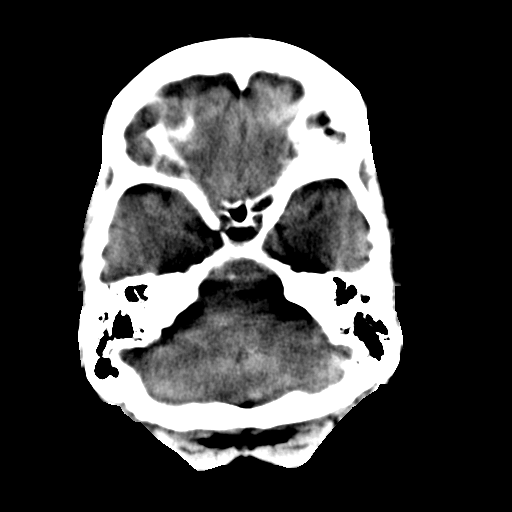
[im 5/29  bone]
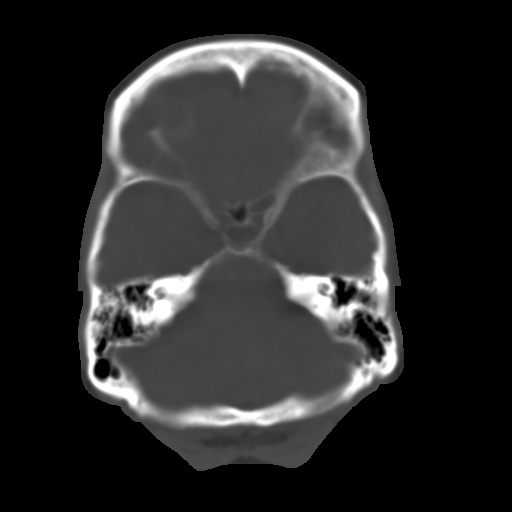
[im 9/29  brain]
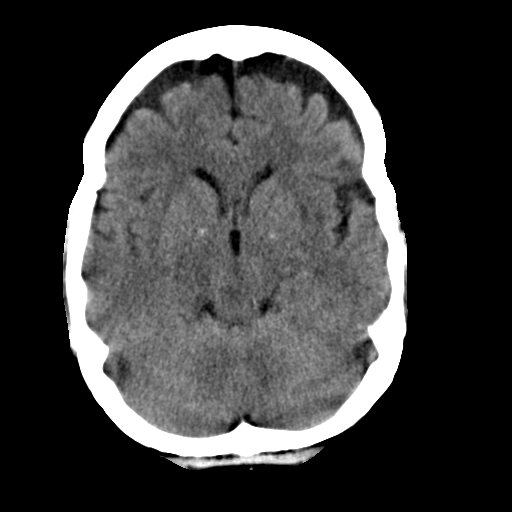
[im 13/29  brain]
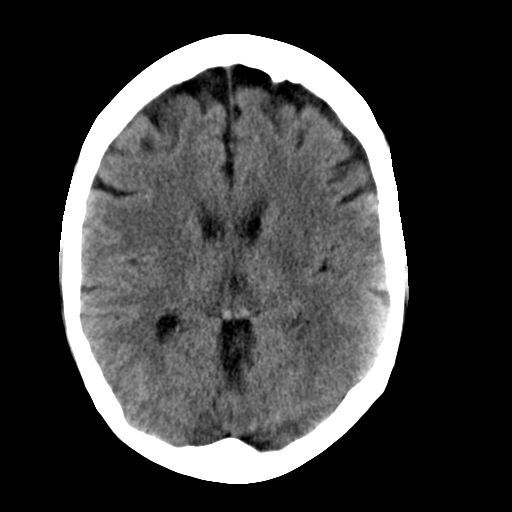
[im 17/29  brain]
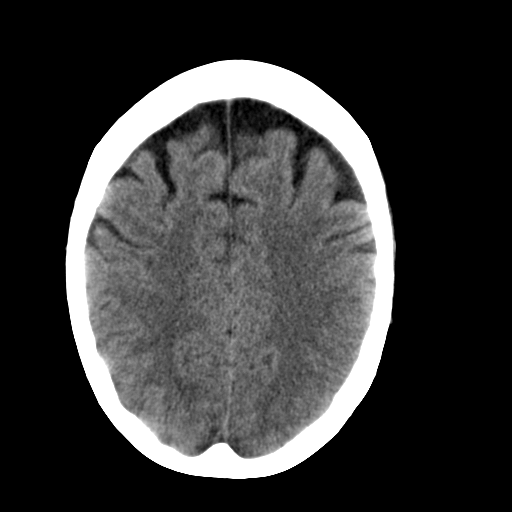
[im 21/29  brain]
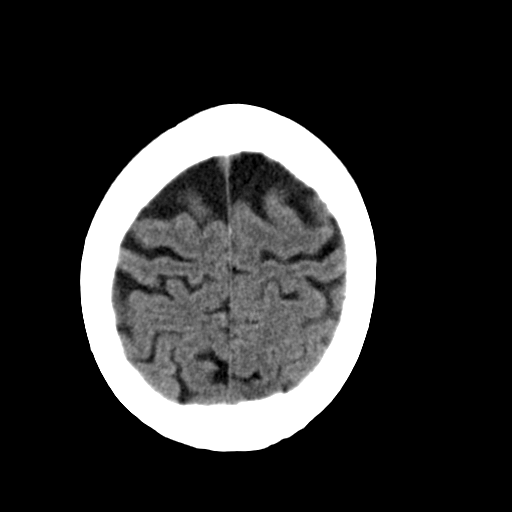
[im 21/29  bone]
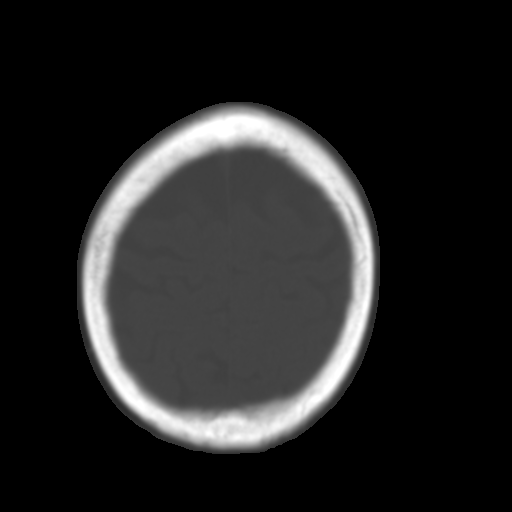
[im 25/29  brain]
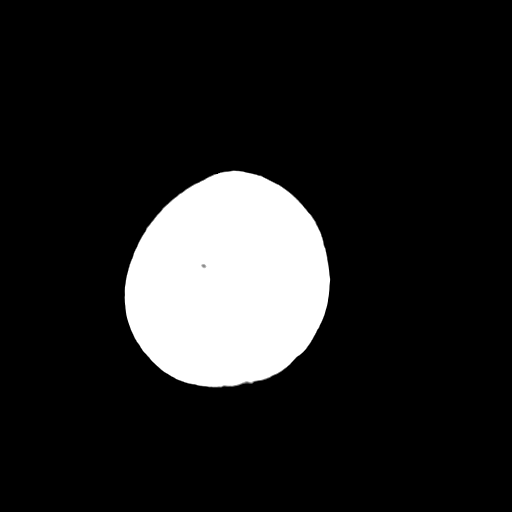

[Series 3: head bone · axial · 0.39mm/px · z∈[-65,+61]mm · 8 of 79 slices shown]
[im 8/79  bone]
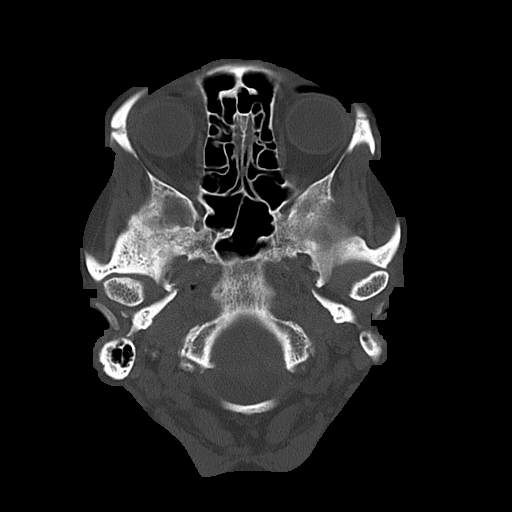
[im 15/79  bone]
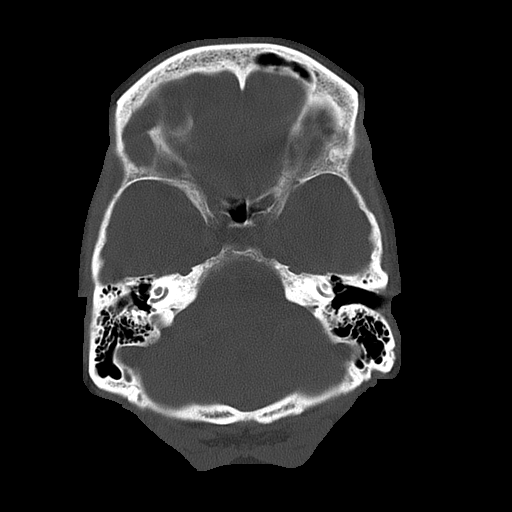
[im 27/79  bone]
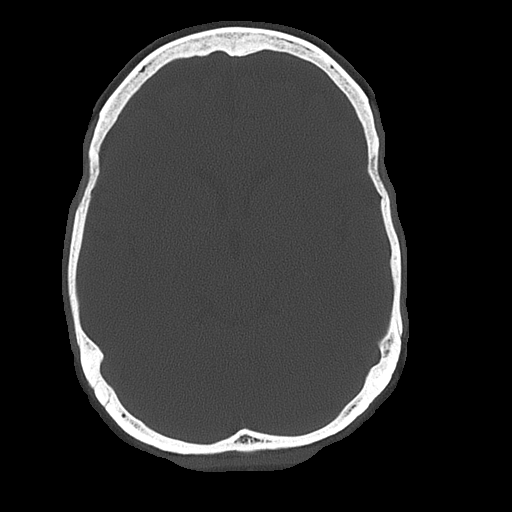
[im 34/79  bone]
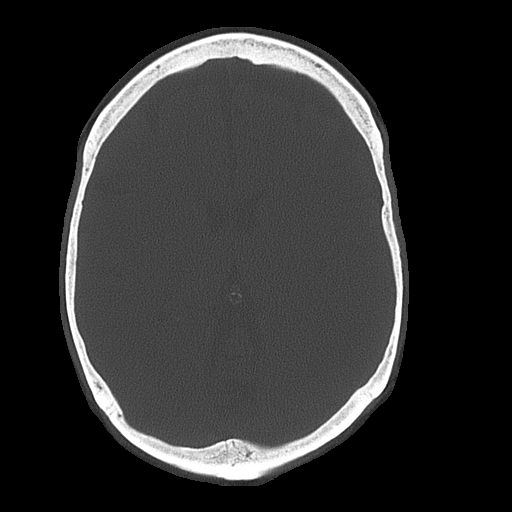
[im 45/79  bone]
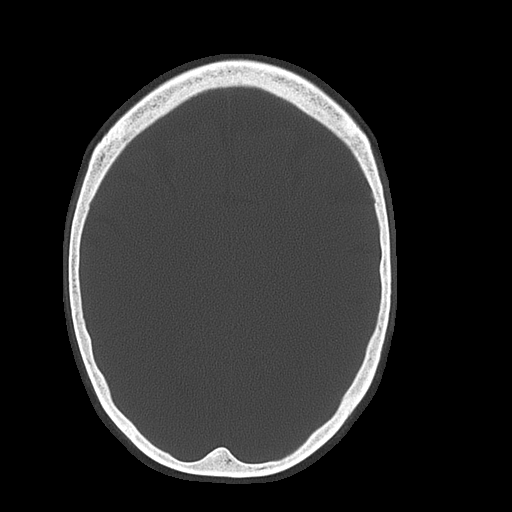
[im 53/79  bone]
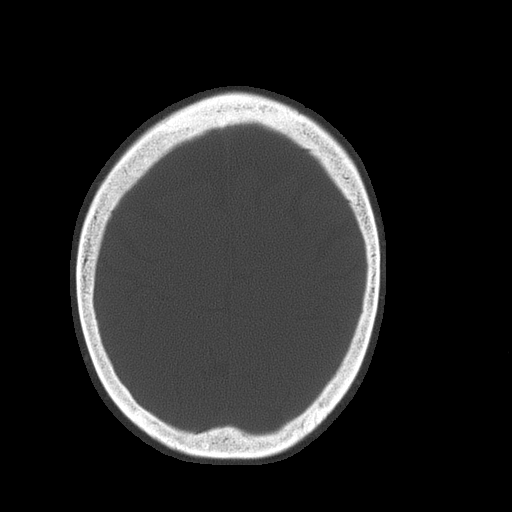
[im 64/79  bone]
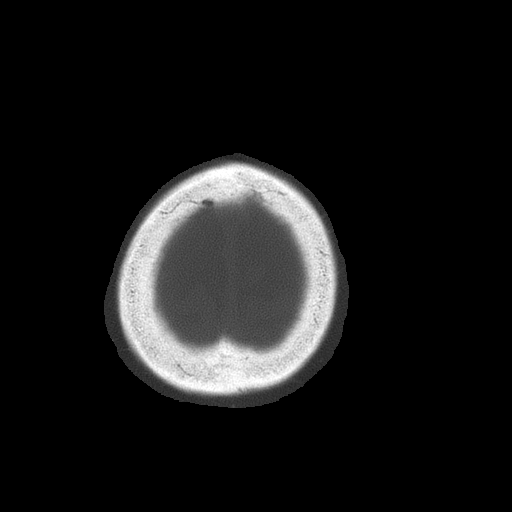
[im 71/79  bone]
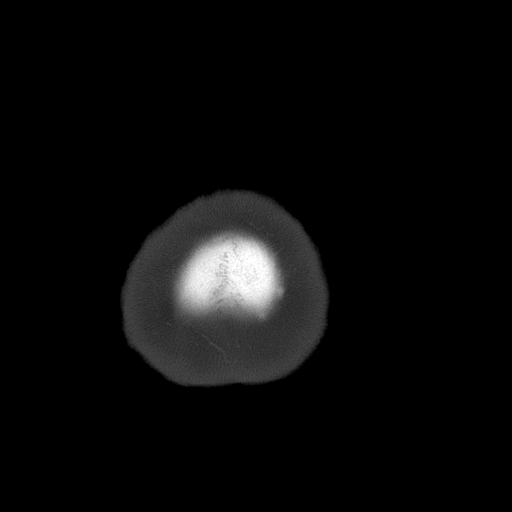

[14 of 30 positions shown; findings below may reference images not displayed]

FINDINGS: There is no evidence of acute infarction, mass lesion, or intra- or
extra-axial hemorrhage on CT.

Prominence of the sulci suggests mild cortical volume loss. Mild
cerebellar atrophy is noted. Mild periventricular and subcortical
white matter change likely reflects small vessel ischemic
microangiopathy.

The brainstem and fourth ventricle are within normal limits. The
basal ganglia are unremarkable in appearance. The cerebral
hemispheres demonstrate grossly normal gray-white differentiation.
No mass effect or midline shift is seen.

There is no evidence of fracture; visualized osseous structures are
unremarkable in appearance. The visualized portions of the orbits
are within normal limits. The paranasal sinuses and mastoid air
cells are well-aerated. No significant soft tissue abnormalities are
seen.
IMPRESSION: 1. No evidence of traumatic intracranial injury or fracture.
2. Mild cortical volume loss and scattered small vessel ischemic
microangiopathy.

## 2017-08-30 ENCOUNTER — Encounter: Payer: Self-pay | Admitting: Urology

## 2017-09-06 NOTE — Progress Notes (Signed)
09/07/2017 11:25 AM   Shannon Pennington 23-Aug-1940 [id number]  Referring provider: Tracie Harrier, MD [address] [facility] [place], [place] [address]  Chief Complaint  Patient presents with  . Recurrent UTI    HPI: Patient is a 77 year old Caucasian female with a history of recurrent UTI's, frequency and vaginal atrophy who presents today with symptoms of an UTI's with her husband.    History of recurrent UTI's + E.coli pan sensitive on 08/02/2017 + E.coli pan sensitive on 07/16/2017 + Klebsiella pneumoniae resistant to ampicillin on 06/30/2017 + Citobacter freundii resistant to ampicillin, cefazolin and cefuroxime on 11/26/2016  RUS in 09/2016 noted mild renal cortical thinning. Small right kidney. Question underlying medical renal disease. Small right kidney could be indicative of a degree of renal artery stenosis. In this regard, question whether patient is hypertensive.  Small left renal cyst. No other mass evident. No obstructing focus in either kidney. Renal echogenicity within normal limits bilaterally.  Risk factors for UTI's: age and vaginal atrophy   Today, she states that she is having suprapubic pressure.  She is also having baseline nocturia.   Patient denies any gross hematuria, dysuria or suprapubic/flank pain.  Patient denies any fevers, chills, nausea or vomiting.  UA with many bacteria.    Frequency She attributes this to drinking a lot of fluid.    Vaginal atrophy Will ask Dr. Kenton Kingfisher if it is appropriate to start vaginal estrogen cream at this time as she is on oral estrogen.    PMH: Past Medical History:  Diagnosis Date  . Anxiety   . GERD (gastroesophageal reflux disease)   . Hyperlipidemia   . Hypertension   . Insomnia   . Osteopenia   . Tachycardia   . Thyroid disease     Surgical History: Past Surgical History:  Procedure Laterality Date  . MELANOMA EXCISION Left 05/2009   [facility]  . TONSILLECTOMY        Home Medications:  Allergies as of 09/07/2017      Reactions   Sulfa Antibiotics Other (See Comments)   G.I upset      Medication List        Accurate as of 09/07/17 11:25 AM. Always use your most recent med list.          atorvastatin 10 MG tablet Commonly known as:  LIPITOR TAKE 1 TABLET BY MOUTH  EVERY EVENING   azelastine 0.1 % nasal spray Commonly known as:  ASTELIN Place 2 sprays into both nostrils every 12 (twelve) hours as needed.   ciprofloxacin 500 MG tablet Commonly known as:  CIPRO Take 1 tablet (500 mg total) by mouth every 12 (twelve) hours.   ciprofloxacin 500 MG tablet Commonly known as:  CIPRO Take 1 tablet (500 mg total) by mouth every 12 (twelve) hours.   conjugated estrogens vaginal cream Commonly known as:  PREMARIN Apply 0.5mg  (pea-sized amount)  just inside the vaginal introitus with a finger-tip on  Monday, Wednesday and Friday nights.   estradiol 0.1 MG/GM vaginal cream Commonly known as:  ESTRACE VAGINAL Apply 0.5mg  (pea-sized amount)  just inside the vaginal introitus with a finger-tip on Monday, Wednesday and Friday nights.   estradiol 1 MG tablet Commonly known as:  ESTRACE Take 1 tablet (1 mg total) by mouth daily.   fluticasone 50 MCG/ACT nasal spray Commonly known as:  FLONASE USE 1-2 SPRAYS IN EACH NOSTRIL ONCE A DAY   hydrochlorothiazide 25 MG tablet Commonly known as:  HYDRODIURIL take 1 tablet by mouth daily   levocetirizine 5 MG tablet Commonly known as:  XYZAL Take by mouth.   levothyroxine 25 MCG tablet Commonly known as:  SYNTHROID, LEVOTHROID Take by mouth.   LORazepam 0.5 MG tablet Commonly known as:  ATIVAN take 1 tablet by mouth once daily   medroxyPROGESTERone 2.5 MG tablet Commonly known as:  PROVERA Take 1 tablet (2.5 mg total) by mouth daily.   metoprolol succinate 25 MG 24 hr tablet Commonly known as:  TOPROL-XL take 1/2 tablet by mouth once daily   temazepam 15 MG capsule Commonly known as:   RESTORIL take 1/2 to 1 capsule by mouth at bedtime if needed       Allergies:  Allergies  Allergen Reactions  . Sulfa Antibiotics Other (See Comments)    G.I upset    Family History: Family History  Problem Relation Age of Onset  . Cancer Father        lung  . Cancer Brother        lung  . Bladder Cancer Neg Hx   . Prostate cancer Neg Hx   . Kidney cancer Neg Hx     Social History:  reports that she quit smoking about 27 years ago. She has never used smokeless tobacco. She reports that she does not drink alcohol or use drugs.  ROS: UROLOGY Frequent Urination?: No Hard to postpone urination?: No Burning/pain with urination?: No Get up at night to urinate?: Yes Leakage of urine?: No Urine stream starts and stops?: No Trouble starting stream?: No Do you have to strain to urinate?: No Blood in urine?: No Urinary tract infection?: Yes Sexually transmitted disease?: No Injury to kidneys or bladder?: No Painful intercourse?: No Weak stream?: No Currently pregnant?: No Vaginal bleeding?: No Last menstrual period?: n  Gastrointestinal Nausea?: No Vomiting?: No Indigestion/heartburn?: No Diarrhea?: No Constipation?: No  Constitutional Fever: No Night sweats?: No Weight loss?: No Fatigue?: Yes  Skin Skin rash/lesions?: No Itching?: No  Eyes Blurred vision?: No Double vision?: No  Ears/Nose/Throat Sore throat?: No Sinus problems?: Yes  Hematologic/Lymphatic Swollen glands?: No Easy bruising?: No  Cardiovascular Leg swelling?: No Chest pain?: No  Respiratory Cough?: No Shortness of breath?: No  Endocrine Excessive thirst?: No  Musculoskeletal Back pain?: No Joint pain?: No  Neurological Headaches?: No Dizziness?: No  Psychologic Depression?: No Anxiety?: No  Physical Exam: BP 135/61   Pulse 72   Resp 16   Ht 5\' 3"  (1.6 m)   Wt 128 lb (58.1 kg)   SpO2 96%   BMI 22.67 kg/m   Constitutional: Well nourished. Alert and  oriented, No acute distress. HEENT: [place] AT, moist mucus membranes. Trachea midline, no masses. Cardiovascular: No clubbing, cyanosis, or edema. Respiratory: Normal respiratory effort, no increased work of breathing. Skin: No rashes, bruises or suspicious lesions. Lymph: No cervical or inguinal adenopathy. Neurologic: Grossly intact, no focal deficits, moving all 4 extremities. Psychiatric: Normal mood and affect.  Laboratory Data: Lab Results  Component Value Date   WBC 4.1 04/12/2015   HGB 14.6 04/12/2015   HCT 42.3 04/12/2015   MCV 88.7 04/12/2015   PLT 174 04/12/2015   Cr 1.1 on 9/17  Urinalysis See HPI and Epic.   Pertinent Imaging: CLINICAL DATA:  Recurrent urinary tract infections  EXAM: RENAL / URINARY TRACT ULTRASOUND COMPLETE  COMPARISON:  None.  FINDINGS: Right Kidney:  Length: 8.0 cm. Echogenicity within normal limits. There is mild renal cortical thinning on the right. No mass, perinephric  fluid, or hydronephrosis visualized. No sonographically demonstrable calculus or ureterectasis.  Left Kidney:  Length: 9.2 cm. Echogenicity within normal limits. There is mild renal cortical thinning. No perinephric fluid or hydronephrosis visualized. There is a cyst in the lower pole left kidney measuring 0.8 x 0.7 x 0.9 cm. No sonographically demonstrable calculus or ureterectasis.  Bladder:  Appears normal for degree of bladder distention.  IMPRESSION: Mild renal cortical thinning. Small right kidney. Question underlying medical renal disease. Small right kidney could be indicative of a degree of renal artery stenosis. In this regard, question whether patient is hypertensive.  Small left renal cyst. No other mass evident. No obstructing focus in either kidney. Renal echogenicity within normal limits bilaterally.   Electronically Signed   By: Lowella Grip III M.D.   On: 09/28/2016 10:09  Assessment & Plan:    1. History of recurrent  UTI's Continue probiotics, cranberry tabs, and hygiene Renal ultrasound unremarkable for contributing factor Advised to come to our office when necessary dysuria for nurse visit UA/urine culture Refused CATH UA Urinalysis many bacteria - will send for culture  2. Urinary frequency Mild baseline urinary frequency/urgency, not interested in pharmacotherapy at this time  3. Vaginal atrophy Will ask Dr. Kenton Kingfisher if is it safe to add vaginal estrogen cream as she is on oral estrogen  -if so:   I explained to the patient that when women go through menopause and her estrogen levels are severely diminished, the normal vaginal flora will change.  This is due to an increase of the vaginal canal's pH. Because of this, the vaginal canal may be colonized by bacteria from the rectum instead of the protective lactobacillus.  This, accompanied by the loss of the mucus barrier with vaginal atrophy, is a cause of recurrent urinary tract infections. In some studies, the use of vaginal estrogen cream has been demonstrated to reduce  recurrent urinary tract infections to one a year.  Patient was given a sample of vaginal estrogen cream (Premarin vaginal cream) and instructed to apply 0.5mg  (pea-sized amount)  just inside the vaginal introitus with a finger-tip on Monday, Wednesday and Friday nights.  I explained to the patient that vaginally administered estrogen, which causes only a slight increase in the blood estrogen levels, have fewer contraindications and adverse systemic effects that oral HT. I have also given prescriptions for the Estrace cream and Premarin cream, so that the patient may carry them to the pharmacy to see which one of the branded creams would be most economical for her.  If she finds both medications cost prohibitive, she is instructed to call the office.  We can then call in a compounded vaginal estrogen cream for the patient that may be more affordable.   She will follow up in three months for an  exam.     Zara Council, [facility]  [place] [address] [place], [place] [address] [phone]

## 2017-09-07 ENCOUNTER — Encounter: Payer: Self-pay | Admitting: Urology

## 2017-09-07 ENCOUNTER — Ambulatory Visit (INDEPENDENT_AMBULATORY_CARE_PROVIDER_SITE_OTHER): Payer: Medicare Other | Admitting: Urology

## 2017-09-07 VITALS — BP 135/61 | HR 72 | Resp 16 | Ht 63.0 in | Wt 128.0 lb

## 2017-09-07 DIAGNOSIS — Z87440 Personal history of urinary (tract) infections: Secondary | ICD-10-CM | POA: Diagnosis not present

## 2017-09-07 DIAGNOSIS — N952 Postmenopausal atrophic vaginitis: Secondary | ICD-10-CM | POA: Diagnosis not present

## 2017-09-07 DIAGNOSIS — R350 Frequency of micturition: Secondary | ICD-10-CM

## 2017-09-07 LAB — MICROSCOPIC EXAMINATION

## 2017-09-07 LAB — URINALYSIS, COMPLETE
BILIRUBIN UA: NEGATIVE
GLUCOSE, UA: NEGATIVE
KETONES UA: NEGATIVE
LEUKOCYTES UA: NEGATIVE
Nitrite, UA: NEGATIVE
PROTEIN UA: NEGATIVE
RBC UA: NEGATIVE
SPEC GRAV UA: 1.015 (ref 1.005–1.030)
Urobilinogen, Ur: 0.2 mg/dL (ref 0.2–1.0)
pH, UA: 5.5 (ref 5.0–7.5)

## 2017-09-07 MED ORDER — ESTROGENS, CONJUGATED 0.625 MG/GM VA CREA: TOPICAL_CREAM | VAGINAL | 12 refills | Status: DC

## 2017-09-07 MED ORDER — ESTRADIOL 0.1 MG/GM VA CREA: TOPICAL_CREAM | VAGINAL | 12 refills | Status: DC

## 2017-09-08 ENCOUNTER — Telehealth: Payer: Self-pay | Admitting: Urology

## 2017-09-08 NOTE — Telephone Encounter (Signed)
Dr. Kenton Kingfisher has messaged me back.  It is okay for Shannon Pennington to start the vaginal estrogen cream at this time.

## 2017-09-09 LAB — CULTURE, URINE COMPREHENSIVE

## 2017-09-09 NOTE — Telephone Encounter (Signed)
Patient notified and voiced understanding.

## 2017-09-10 ENCOUNTER — Telehealth: Payer: Self-pay

## 2017-09-10 MED ORDER — AMOXICILLIN-POT CLAVULANATE 875-125 MG PO TABS: 1.0000 | ORAL_TABLET | Freq: Two times a day (BID) | ORAL | 0 refills | Status: AC

## 2017-09-10 NOTE — Telephone Encounter (Signed)
-----   Message from Nori Riis, PA-C sent at 09/09/2017  4:48 PM EDT ----- Please let Mrs. Stammen know that her urine culture was positive.  She needs to start Augmentin 875/125, twice daily for seven days.

## 2017-09-10 NOTE — Telephone Encounter (Signed)
Pt informed, rx sent to pharmacy.

## 2017-12-08 ENCOUNTER — Ambulatory Visit: Payer: Medicare Other | Admitting: Urology

## 2018-01-22 IMAGING — US US RENAL
1 series · 14 of 25 positions shown · non-contrast
Comparison: None.

CLINICAL DATA: Recurrent urinary tract infections

EXAM:
RENAL / URINARY TRACT ULTRASOUND COMPLETE

[Series 1: us renal · 0.22mm/px · 14 of 28 slices shown]
[im 1/28]
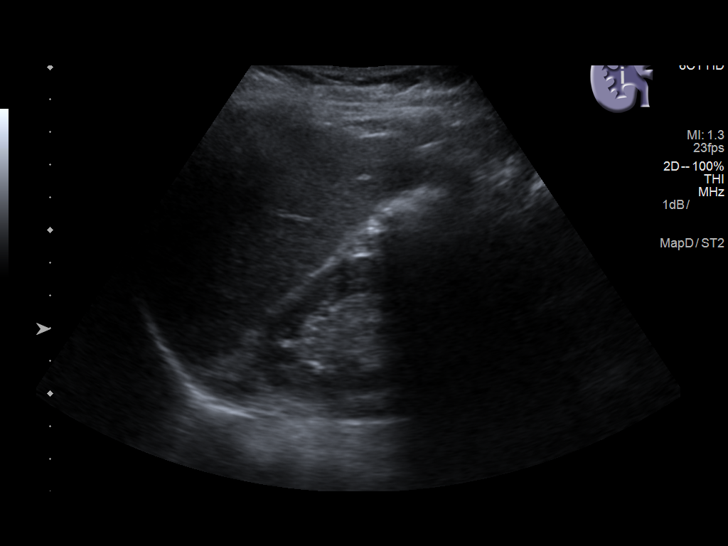
[im 3/28]
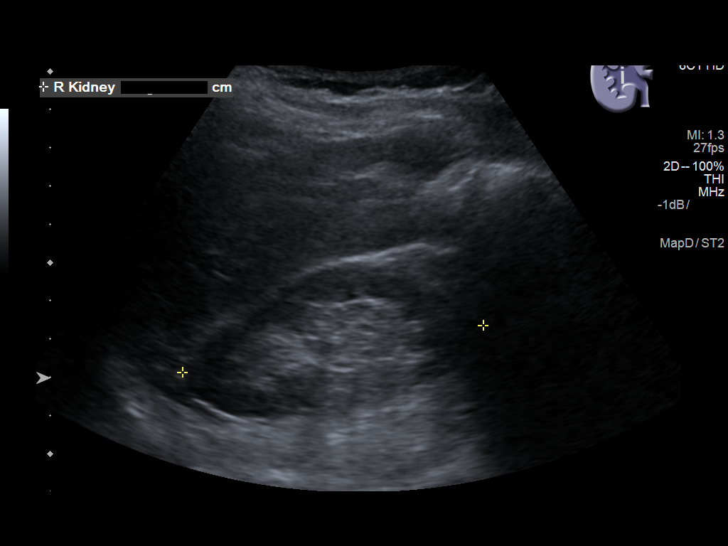
[im 5/28]
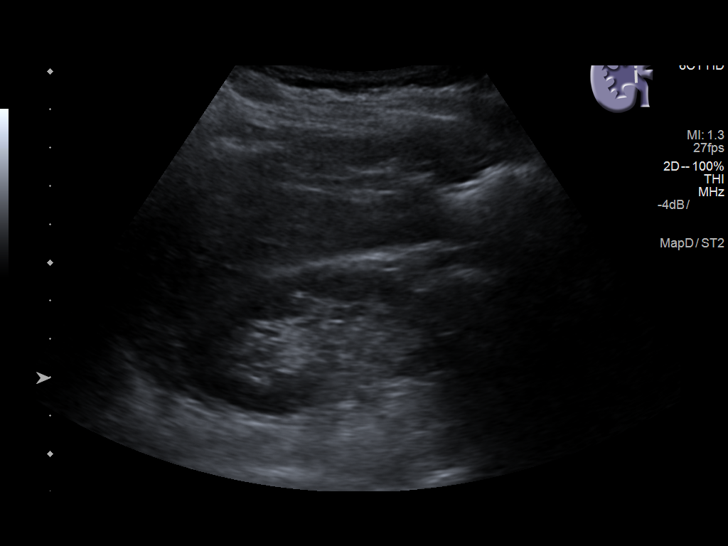
[im 7/28]
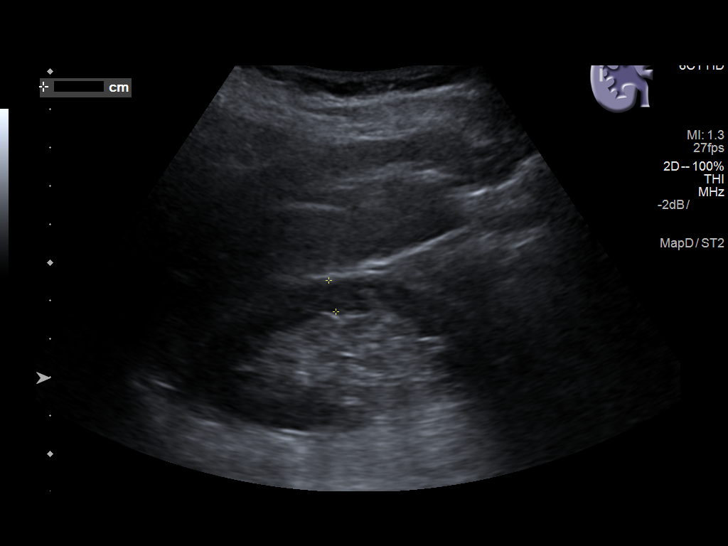
[im 10/28]
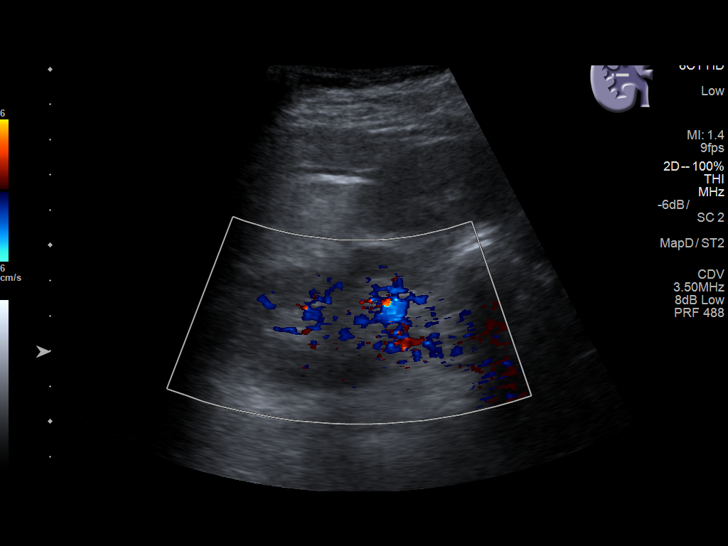
[im 11/28]
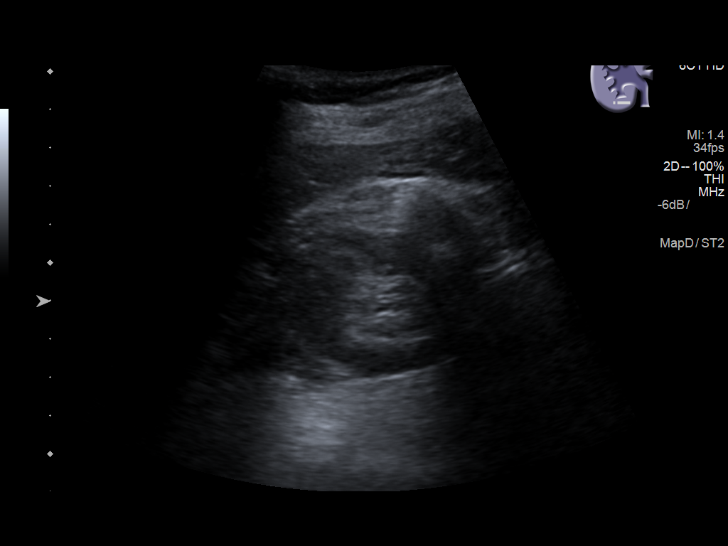
[im 13/28]
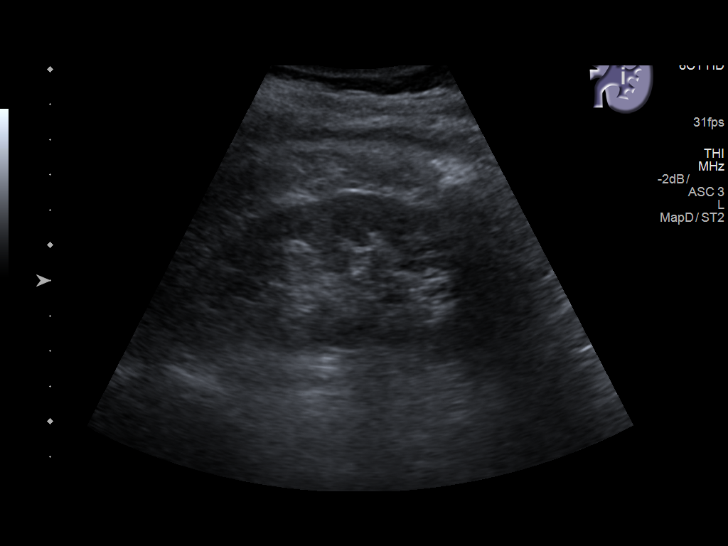
[im 15/28]
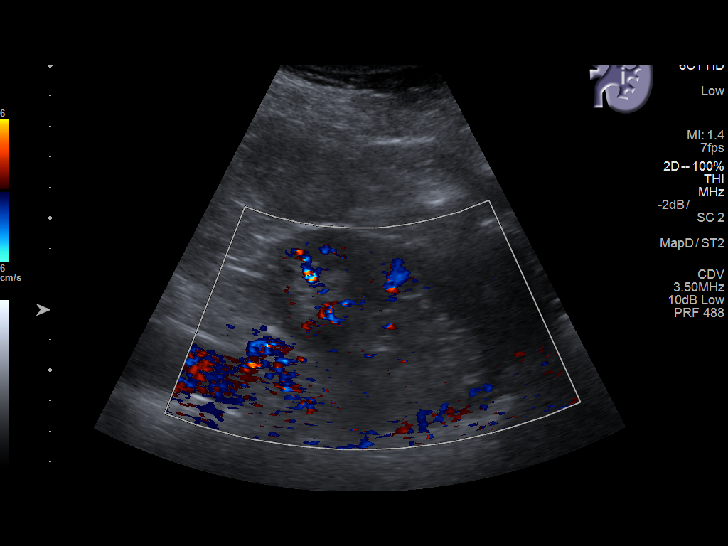
[im 17/28]
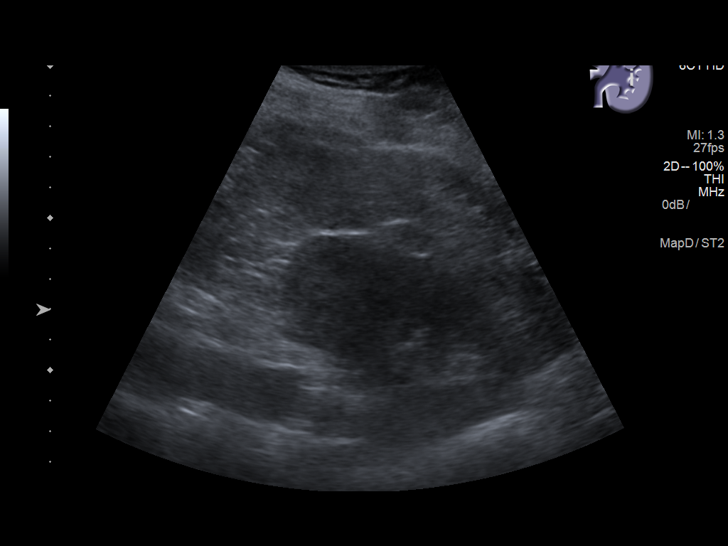
[im 19/28]
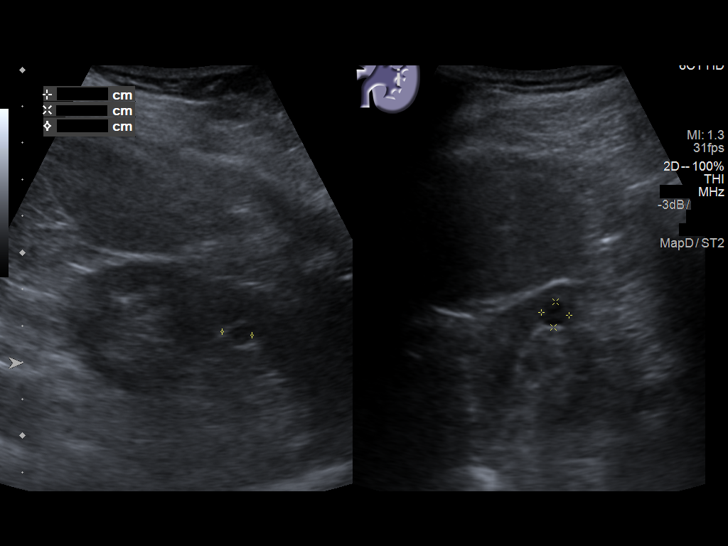
[im 21/28]
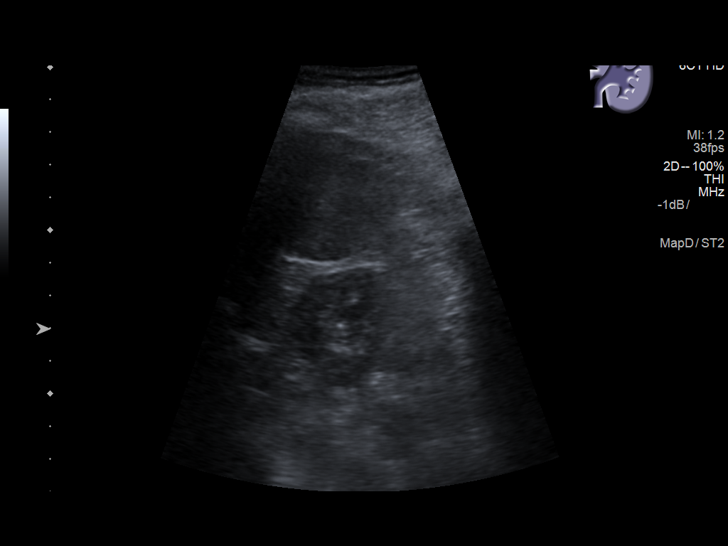
[im 23/28]
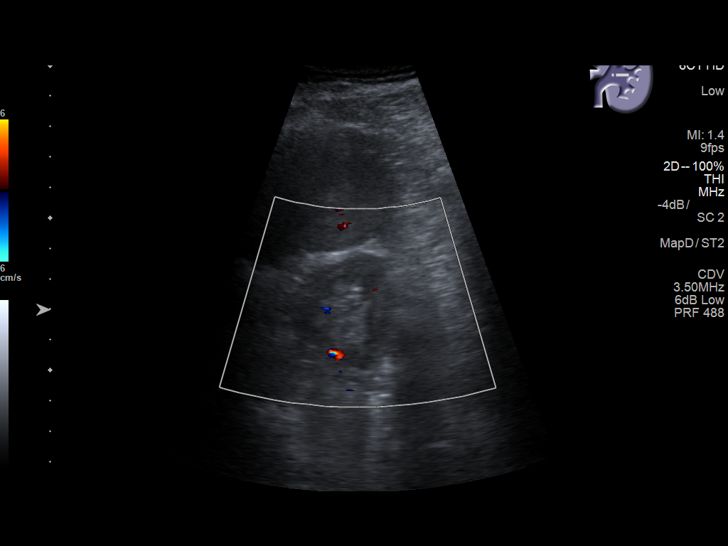
[im 25/28]
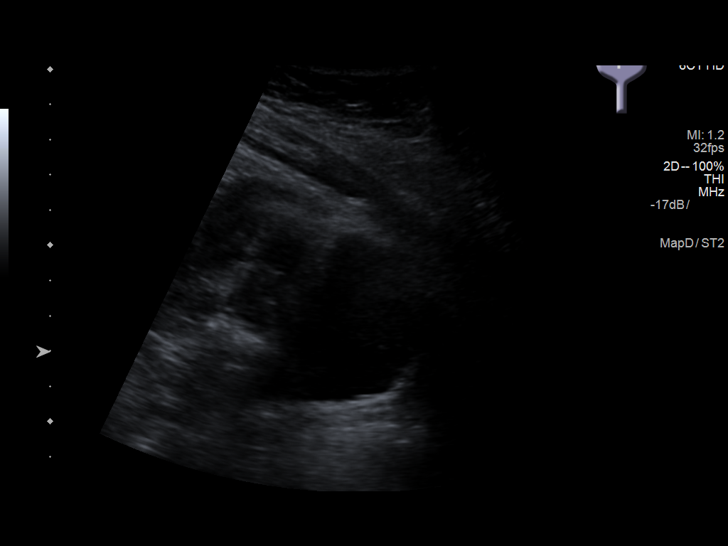
[im 28/28]
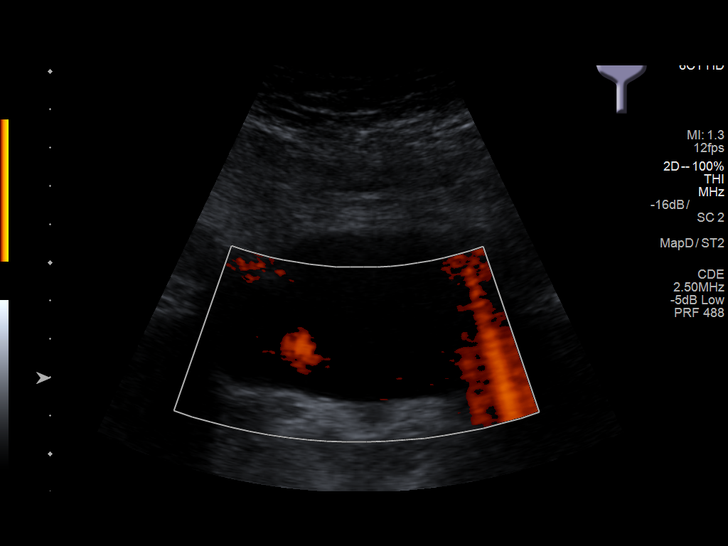

[14 of 25 positions shown; findings below may reference images not displayed]

FINDINGS: Right Kidney:

Length: 8.0 cm. Echogenicity within normal limits. There is mild
renal cortical thinning on the right. No mass, perinephric fluid, or
hydronephrosis visualized. No sonographically demonstrable calculus
or ureterectasis.

Left Kidney:

Length: 9.2 cm. Echogenicity within normal limits. There is mild
renal cortical thinning. No perinephric fluid or hydronephrosis
visualized. There is a cyst in the lower pole left kidney measuring
0.8 x 0.7 x 0.9 cm. No sonographically demonstrable calculus or
ureterectasis.

Bladder:

Appears normal for degree of bladder distention.
IMPRESSION: Mild renal cortical thinning. Small right kidney. Question
underlying medical renal disease. Small right kidney could be
indicative of a degree of renal artery stenosis. In this regard,
question whether patient is hypertensive.

Small left renal cyst. No other mass evident. No obstructing focus
in either kidney. Renal echogenicity within normal limits
bilaterally.

## 2018-01-26 ENCOUNTER — Telehealth: Payer: Self-pay

## 2018-01-26 NOTE — Telephone Encounter (Signed)
PA completed waiting on response

## 2018-01-26 NOTE — Telephone Encounter (Signed)
Pt received letter from [facility] stating that for her to continue Estradiol 1mg  in January a prior authorization needs to be done.  [phone]

## 2018-05-19 ENCOUNTER — Other Ambulatory Visit: Payer: Self-pay | Admitting: Family Medicine

## 2018-06-09 ENCOUNTER — Other Ambulatory Visit: Payer: Self-pay | Admitting: Family Medicine

## 2018-07-26 ENCOUNTER — Other Ambulatory Visit: Payer: Self-pay | Admitting: Obstetrics & Gynecology

## 2018-07-27 ENCOUNTER — Other Ambulatory Visit: Payer: Self-pay | Admitting: Obstetrics & Gynecology

## 2018-07-27 NOTE — Telephone Encounter (Signed)
Patient is schedule 08/15/18 with [facility]

## 2018-07-27 NOTE — Telephone Encounter (Signed)
Sch annual, or at least follow up medication check

## 2018-08-15 ENCOUNTER — Ambulatory Visit (INDEPENDENT_AMBULATORY_CARE_PROVIDER_SITE_OTHER): Payer: Medicare Other | Admitting: Obstetrics & Gynecology

## 2018-08-15 ENCOUNTER — Other Ambulatory Visit: Payer: Self-pay

## 2018-08-15 ENCOUNTER — Encounter: Payer: Self-pay | Admitting: Obstetrics & Gynecology

## 2018-08-15 DIAGNOSIS — R232 Flushing: Secondary | ICD-10-CM | POA: Diagnosis not present

## 2018-08-15 NOTE — Progress Notes (Signed)
Virtual Visit via Telephone Note  I connected with Shannon Pennington on 08/15/18 at  2:10 PM EDT by telephone and verified that I am speaking with the correct person using two identifiers.   I discussed the limitations, risks, security and privacy concerns of performing an evaluation and management service by telephone and the availability of in person appointments. I also discussed with the patient that there may be a patient responsible charge related to this service. The patient expressed understanding and agreed to proceed. She was at home and I was in my office.  History of Present Illness:   Shannon Pennington is a 78 y.o. who has been taking HRT for approximately 7  Years, this for hot flashes and sweats, which has greatly helped with daily tolerability and functioning. Since that time, she states that her symptoms are improved.  She has tried to come off in past without success.  PMHx: She  has a past medical history of Anxiety, GERD (gastroesophageal reflux disease), Hyperlipidemia, Hypertension, Insomnia, Osteopenia, Tachycardia, and Thyroid disease. Also,  has a past surgical history that includes Tonsillectomy and Melanoma excision (Left, 05/2009)., family history includes Cancer in her brother and father.,  reports that she quit smoking about 28 years ago. She has never used smokeless tobacco. She reports that she does not drink alcohol or use drugs. No outpatient medications have been marked as taking for the 08/15/18 encounter (Office Visit) with Gae Dry, MD.  . Also, is allergic to sulfa antibiotics..  Review of Systems  Constitutional: Negative for chills, fever and malaise/fatigue.  HENT: Negative for congestion, sinus pain and sore throat.   Eyes: Negative for blurred vision and pain.  Respiratory: Negative for cough and wheezing.   Cardiovascular: Negative for chest pain and leg swelling.  Gastrointestinal: Negative for abdominal pain, constipation, diarrhea, heartburn,  nausea and vomiting.  Genitourinary: Negative for dysuria, frequency, hematuria and urgency.  Musculoskeletal: Negative for back pain, joint pain, myalgias and neck pain.  Skin: Negative for itching and rash.  Neurological: Negative for dizziness, tremors and weakness.  Endo/Heme/Allergies: Does not bruise/bleed easily.  Psychiatric/Behavioral: Negative for depression. The patient is not nervous/anxious and does not have insomnia.   All other systems reviewed and are negative.   Observations/Objective: No exam today, due to telephone eVisit due to [facility] virus restriction on elective visits and procedures.  Prior visits reviewed along with ultrasounds/labs as indicated.  Assessment and Plan: 1. Vasomotor flushing Cont HRT- Estrace 1 mg daily and Provera 2.5 mg daily No side effects.  Counseled on concern for risks of HRT including breast cancer and cardiovascular disease.    In fact, patient refuses to have MMG done as she has in the past as well.  Last breast exam one month ago w PCP.    Plan: She will undergo no change in her medical therapy.  She was amenable to this plan and we will see her back for annual/PRN.  Follow Up Instructions: As needed and one year   I discussed the assessment and treatment plan with the patient. The patient was provided an opportunity to ask questions and all were answered. The patient agreed with the plan and demonstrated an understanding of the instructions.   The patient was advised to call back or seek an in-person evaluation if the symptoms worsen or if the condition fails to improve as anticipated.  I provided 15 minutes of non-face-to-face time during this encounter.   Hoyt Koch, MD

## 2018-08-25 ENCOUNTER — Other Ambulatory Visit: Payer: Self-pay | Admitting: Obstetrics & Gynecology

## 2018-08-26 ENCOUNTER — Telehealth: Payer: Self-pay

## 2018-08-26 MED ORDER — ESTRADIOL 1 MG PO TABS: ORAL_TABLET | ORAL | 3 refills | Status: DC

## 2018-08-26 MED ORDER — MEDROXYPROGESTERONE ACETATE 2.5 MG PO TABS: ORAL_TABLET | ORAL | 3 refills | Status: DC

## 2018-08-26 NOTE — Telephone Encounter (Signed)
Pt calling; had phone visit c [place]; he was going to call in refills of her two hormone medications; pharm hasn't received it.  [phone]  Left detailed msg that on 6/10 a 90d supply was sent in.  I will send in refills for one yr.

## 2018-08-29 ENCOUNTER — Other Ambulatory Visit: Payer: Self-pay

## 2018-08-29 MED ORDER — MEDROXYPROGESTERONE ACETATE 2.5 MG PO TABS: ORAL_TABLET | ORAL | 3 refills | Status: DC

## 2018-08-29 MED ORDER — ESTRADIOL 1 MG PO TABS: ORAL_TABLET | ORAL | 3 refills | Status: DC

## 2018-08-29 NOTE — Telephone Encounter (Signed)
Pt calling to confirm rxs were sent to [facility] on [address]  [phone]  Pt aware rxs have been sent to [facility].  Pt states [facility] called [facility] and got the order.  Pt has rxs.

## 2019-01-27 ENCOUNTER — Telehealth: Payer: Self-pay

## 2019-01-27 NOTE — Telephone Encounter (Signed)
Pt's husband Mariea Clonts called triage and left voice msg on behalf of wife. Mentioned insurance is not renewing estradiol tablets unless it is requested by provider. Please advise.

## 2019-01-30 ENCOUNTER — Other Ambulatory Visit: Payer: Self-pay | Admitting: Obstetrics & Gynecology

## 2019-01-30 NOTE — Telephone Encounter (Signed)
Look into PA or what needs to be done.  This is generic Estrace and covered most times.

## 2019-08-07 ENCOUNTER — Encounter: Payer: Self-pay | Admitting: Dermatology

## 2019-08-07 ENCOUNTER — Ambulatory Visit: Payer: Medicare Other | Admitting: Dermatology

## 2019-08-07 ENCOUNTER — Other Ambulatory Visit: Payer: Self-pay

## 2019-08-07 DIAGNOSIS — D1801 Hemangioma of skin and subcutaneous tissue: Secondary | ICD-10-CM | POA: Diagnosis not present

## 2019-08-07 DIAGNOSIS — D2271 Melanocytic nevi of right lower limb, including hip: Secondary | ICD-10-CM | POA: Diagnosis not present

## 2019-08-07 DIAGNOSIS — L820 Inflamed seborrheic keratosis: Secondary | ICD-10-CM | POA: Diagnosis not present

## 2019-08-07 DIAGNOSIS — Z85828 Personal history of other malignant neoplasm of skin: Secondary | ICD-10-CM

## 2019-08-07 DIAGNOSIS — Z1283 Encounter for screening for malignant neoplasm of skin: Secondary | ICD-10-CM | POA: Diagnosis not present

## 2019-08-07 DIAGNOSIS — I781 Nevus, non-neoplastic: Secondary | ICD-10-CM

## 2019-08-07 DIAGNOSIS — L905 Scar conditions and fibrosis of skin: Secondary | ICD-10-CM | POA: Diagnosis not present

## 2019-08-07 DIAGNOSIS — D229 Melanocytic nevi, unspecified: Secondary | ICD-10-CM

## 2019-08-07 DIAGNOSIS — L821 Other seborrheic keratosis: Secondary | ICD-10-CM

## 2019-08-07 DIAGNOSIS — L578 Other skin changes due to chronic exposure to nonionizing radiation: Secondary | ICD-10-CM

## 2019-08-07 NOTE — Patient Instructions (Signed)
Cryotherapy Aftercare  . Wash gently with soap and water everyday.   . Apply Vaseline and Band-Aid daily until healed.  

## 2019-08-07 NOTE — Progress Notes (Signed)
   Follow-Up Visit   Subjective  Shannon Pennington is a 79 y.o. female who presents for the following: Annual Exam (total body skin exam), check spot (R leg, ~2wks, pink but no symptoms), check scaly spot (L shoulder, no symptoms), growth (L side/post shoulder), and red bump (R wrist x 54m). It gets irritated.   The following portions of the chart were reviewed this encounter and updated as appropriate:      Review of Systems:  No other skin or systemic complaints except as noted in HPI or Assessment and Plan.  Objective  Well appearing patient in no apparent distress; mood and affect are within normal limits.  A full examination was performed including scalp, head, eyes, ears, nose, lips, neck, chest, axillae, abdomen, back, buttocks, bilateral upper extremities, bilateral lower extremities, hands, feet, fingers, toes, fingernails, and toenails. All findings within normal limits unless otherwise noted below.  Objective    R pretibial: Waxy tan macule with erythema  Right Wrist x 1: Erythematous keratotic or waxy stuck-on papule or plaque.   Objective  Left Upper Arm, R ant thigh: Linear scar  Objective  Right Thigh - Anterior: 2.0 x 1.5cm yellow brown speckled patch, present for years, no changes   Assessment & Plan    Seborrheic Keratoses - Stuck-on, waxy, tan-brown papules and plaques L shoulder - Discussed benign etiology and prognosis. - Observe - Call for any changes  Melanocytic Nevi - Tan-brown and/or pink-flesh-colored symmetric macules and papules, L post axilla - Benign appearing on exam today - Observation - Call clinic for new or changing moles - Recommend daily use of broad spectrum spf 30+ sunscreen to sun-exposed areas.   Hemangiomas - Red papules - Discussed benign nature - Observe - Call for any changes  Telangiectasia - Dilated blood vessel upper back - Benign appearing on exam - Call for changes  Actinic Damage - diffuse scaly  erythematous macules with underlying dyspigmentation - Recommend daily broad spectrum sunscreen SPF 30+ to sun-exposed areas, reapply every 2 hours as needed.  - Call for new or changing lesions.  Skin cancer screening performed today.  Inflamed seborrheic keratosis (2) Right Wrist x 1; R pretibial  ISK R lower pretibia, start HC cream qd  Destruction of lesion - Right Wrist x 1  Destruction method: cryotherapy   Informed consent: discussed and consent obtained   Lesion destroyed using liquid nitrogen: Yes   Region frozen until ice ball extended beyond lesion: Yes   Outcome: patient tolerated procedure well with no complications   Post-procedure details: wound care instructions given    Scar Left Upper Arm, R ant thigh  Hx Skin Ca txted in the past, clear, observe  Nevus Right Thigh - Anterior  Nevus spilus, benign appearing, observe  Hemangiomas - Red papules - Discussed benign nature - Observe - Call for any changes  History of Skin Cancer   Clear. Observe for recurrence. Call clinic for new or changing lesions.  Recommend regular skin exams, daily broad-spectrum spf 30+ sunscreen use, and photoprotection.     Return in about 1 year (around 08/06/2020) for TBSE.  I, Othelia Pulling, RMA, am acting as scribe for Brendolyn Patty, MD .  Documentation: I have reviewed the above documentation for accuracy and completeness, and I agree with the above.  Brendolyn Patty MD

## 2019-09-05 ENCOUNTER — Ambulatory Visit
Admission: EM | Admit: 2019-09-05 | Discharge: 2019-09-05 | Disposition: A | Discharge status: Home / Self Care | Payer: Medicare Other | Attending: Family Medicine | Admitting: Family Medicine

## 2019-09-05 DIAGNOSIS — J0110 Acute frontal sinusitis, unspecified: Secondary | ICD-10-CM | POA: Diagnosis not present

## 2019-09-05 MED ORDER — AZITHROMYCIN 250 MG PO TABS: 250.0000 mg | ORAL_TABLET | Freq: Every day | ORAL | 0 refills | Status: DC

## 2019-09-05 NOTE — ED Triage Notes (Addendum)
Pt presents to UC for possible sinus infection. Pt states she has had headache, ear fullness, green phlegm, and fatigue x1 week. Pt denies fever, chills, n/v/d, loss of taste/smell, sore throat. Pt has been treating at home with tylenol, mucinex with out relief. Pt not interested in covid testing at this time.

## 2019-09-05 NOTE — Discharge Instructions (Addendum)
You have a sinus infection.  I have prescribed azithromycin for you to take. Take 2 tablets today, and one tablet everyday for the next 4 days  Follow up with this office or with primary care if you are not feeling better over the next 2 days.  Follow up with the ER for trouble swallowing, trouble breathing, other concerning symptoms.

## 2019-09-05 NOTE — ED Provider Notes (Signed)
[facility]   [id number] 09/05/19 Arrival Time: 1254  SK:AJGO THROAT  SUBJECTIVE: History from: patient.  Shannon Pennington is a 79 y.o. female who presents with abrupt onset of nasal congestion, headache, fatigue for the last week. Denies sick exposure to Covid, strep, flu or mono, or precipitating event.  Has taken Tylenol and Mucinex with no relief.  Declines Covid testing at this time.  Has had both Covid vaccines. There are no aggravating symptoms. Reports previous symptoms in the past.     Denies fever, chills, ear pain, rhinorrhea, cough, SOB, wheezing, chest pain, nausea, rash, changes in bowel or bladder habits.    ROS: As per HPI.  All other pertinent ROS negative.     Past Medical History:  Diagnosis Date  . Anxiety   . GERD (gastroesophageal reflux disease)   . Hyperlipidemia   . Hypertension   . Insomnia   . Osteopenia   . Skin cancer    L upper arm  . Tachycardia   . Thyroid disease    Past Surgical History:  Procedure Laterality Date  . MELANOMA EXCISION Left 05/2009   [facility]  . TONSILLECTOMY     Allergies  Allergen Reactions  . Amoxicillin-Pot Clavulanate Hives  . Sulfa Antibiotics Other (See Comments)    G.I upset   No current facility-administered medications on file prior to encounter.   Current Outpatient Medications on File Prior to Encounter  Medication Sig Dispense Refill  . atorvastatin (LIPITOR) 10 MG tablet TAKE 1 TABLET BY MOUTH  EVERY EVENING 90 tablet 4  . azelastine (ASTELIN) 0.1 % nasal spray Place 2 sprays into both nostrils every 12 (twelve) hours as needed.    Marland Kitchen estradiol (ESTRACE) 1 MG tablet TAKE 1 TABLET(1 MG) BY MOUTH DAILY 90 tablet 3  . fluticasone (FLONASE) 50 MCG/ACT nasal spray USE 1-2 SPRAYS IN EACH NOSTRIL ONCE A DAY  3  . hydrochlorothiazide (HYDRODIURIL) 25 MG tablet take 1 tablet by mouth daily 90 tablet 4  . levocetirizine (XYZAL) 5 MG tablet Take by mouth.    . levothyroxine (SYNTHROID,  LEVOTHROID) 25 MCG tablet Take by mouth.    Marland Kitchen LORazepam (ATIVAN) 0.5 MG tablet take 1 tablet by mouth once daily 30 tablet 4  . medroxyPROGESTERone (PROVERA) 2.5 MG tablet TAKE 1 TABLET(2.5 MG) BY MOUTH DAILY 90 tablet 3  . metoprolol succinate (TOPROL-XL) 25 MG 24 hr tablet take 1/2 tablet by mouth once daily 30 tablet 12  . temazepam (RESTORIL) 15 MG capsule take 1/2 to 1 capsule by mouth at bedtime if needed 30 capsule 3   Social History   Socioeconomic History  . Marital status: Married    Spouse name: Not on file  . Number of children: Not on file  . Years of education: Not on file  . Highest education level: Not on file  Occupational History  . Not on file  Tobacco Use  . Smoking status: Former Smoker    Quit date: 02/16/1990    Years since quitting: 29.5  . Smokeless tobacco: Never Used  Vaping Use  . Vaping Use: Never used  Substance and Sexual Activity  . Alcohol use: No    Alcohol/week: 0.0 standard drinks  . Drug use: No  . Sexual activity: Not on file  Other Topics Concern  . Not on file  Social History Narrative  . Not on file   Social Determinants of Health   Financial Resource Strain:   . Difficulty of Paying  Living Expenses:   Food Insecurity:   . Worried About Charity fundraiser in the Last Year:   . Arboriculturist in the Last Year:   Transportation Needs:   . Film/video editor (Medical):   Marland Kitchen Lack of Transportation (Non-Medical):   Physical Activity:   . Days of Exercise per Week:   . Minutes of Exercise per Session:   Stress:   . Feeling of Stress :   Social Connections:   . Frequency of Communication with Friends and Family:   . Frequency of Social Gatherings with Friends and Family:   . Attends Religious Services:   . Active Member of Clubs or Organizations:   . Attends Archivist Meetings:   Marland Kitchen Marital Status:   Intimate Partner Violence:   . Fear of Current or Ex-Partner:   . Emotionally Abused:   Marland Kitchen Physically Abused:   .  Sexually Abused:    Family History  Problem Relation Age of Onset  . Cancer Father        lung  . Cancer Brother        lung  . Bladder Cancer Neg Hx   . Prostate cancer Neg Hx   . Kidney cancer Neg Hx     OBJECTIVE:  Vitals:   09/05/19 1300  BP: (!) 142/83  Pulse: 61  Resp: 16  Temp: 98.2 F (36.8 C)  TempSrc: Oral  SpO2: 98%     General appearance: alert; appears fatigued, but nontoxic, speaking in full sentences and managing own secretions HEENT: NCAT; Ears: EACs clear, TMs pearly gray with visible cone of light, without erythema; Eyes: PERRL, EOMI grossly; Nose: no obvious rhinorrhea; Throat: oropharynx clear, tonsils mildly erythematous without white tonsillar exudates, uvula midline Sinuses: frontal sinus tenderness Neck: supple without LAD Lungs: CTA bilaterally without adventitious breath sounds; cough absent Heart: regular rate and rhythm.  Radial pulses 2+ symmetrical bilaterally Skin: warm and dry Psychological: alert and cooperative; normal mood and affect  LABS: No results found for this or any previous visit (from the past 24 hour(s)).   ASSESSMENT & PLAN:  1. Acute non-recurrent frontal sinusitis     Meds ordered this encounter  Medications  . azithromycin (ZITHROMAX) 250 MG tablet    Sig: Take 1 tablet (250 mg total) by mouth daily. Take first 2 tablets together, then 1 every day until finished.    Dispense:  6 tablet    Refill:  0    Order Specific Question:   Supervising Provider    Answer:   Chase Picket [id number]    Acute Sinusitis Push fluids and get rest Prescribed azithromycin.   Take as directed and to completion.  Drink warm or cool liquids, use throat lozenges, or popsicles to help alleviate symptoms Take OTC ibuprofen or tylenol as needed for pain Follow up with PCP if symptoms persist Return or go to ER if you have any new or worsening symptoms such as fever, chills, nausea, vomiting, worsening sore throat, cough, abdominal  pain, chest pain, changes in bowel or bladder habits.   Reviewed expectations re: course of current medical issues. Questions answered. Outlined signs and symptoms indicating need for more acute intervention. Patient verbalized understanding. After Visit Summary given.          Faustino Congress, NP 09/05/19 1321

## 2019-10-06 ENCOUNTER — Ambulatory Visit
Admission: RE | Admit: 2019-10-06 | Discharge: 2019-10-06 | Disposition: A | Discharge status: Home / Self Care | Payer: Medicare Other | Source: Ambulatory Visit | Attending: Physician Assistant | Admitting: Physician Assistant

## 2019-10-06 ENCOUNTER — Other Ambulatory Visit: Payer: Self-pay | Admitting: Physician Assistant

## 2019-10-06 ENCOUNTER — Other Ambulatory Visit: Payer: Self-pay

## 2019-10-06 DIAGNOSIS — J984 Other disorders of lung: Secondary | ICD-10-CM

## 2019-10-06 LAB — POCT I-STAT CREATININE: Creatinine, Ser: 1.3 mg/dL — ABNORMAL HIGH (ref 0.44–1.00)

## 2019-10-06 MED ORDER — IOHEXOL 300 MG/ML  SOLN: 75.0000 mL | Freq: Once | INTRAMUSCULAR | Status: DC | PRN

## 2019-10-06 MED ORDER — IOHEXOL 300 MG/ML  SOLN
60.0000 mL | Freq: Once | INTRAMUSCULAR | Status: AC | PRN
  Administered 2019-10-06: 60 mL via INTRAVENOUS

## 2019-10-17 ENCOUNTER — Other Ambulatory Visit: Payer: Self-pay | Admitting: Obstetrics & Gynecology

## 2019-10-17 NOTE — Telephone Encounter (Signed)
Sch Follow Up appt as it has been > 1 year

## 2019-10-17 NOTE — Telephone Encounter (Signed)
Called and left voicemail for patient to call back to be scheduled. 

## 2019-10-18 ENCOUNTER — Other Ambulatory Visit: Payer: Self-pay | Admitting: Obstetrics & Gynecology

## 2019-10-18 NOTE — Telephone Encounter (Signed)
Needs follow up appt (yearly,not an annual, but yearly check up) prior to continuation of refills

## 2019-10-18 NOTE — Telephone Encounter (Signed)
Called and left generic message for patient to call back to be scheduled 

## 2019-10-18 NOTE — Telephone Encounter (Signed)
Called.

## 2019-11-28 ENCOUNTER — Other Ambulatory Visit: Payer: Self-pay | Admitting: Specialist

## 2019-11-28 DIAGNOSIS — R918 Other nonspecific abnormal finding of lung field: Secondary | ICD-10-CM

## 2019-12-22 ENCOUNTER — Other Ambulatory Visit: Payer: Self-pay

## 2019-12-22 ENCOUNTER — Ambulatory Visit
Admission: RE | Admit: 2019-12-22 | Discharge: 2019-12-22 | Disposition: A | Discharge status: Home / Self Care | Payer: Medicare Other | Source: Ambulatory Visit | Attending: Specialist | Admitting: Specialist

## 2019-12-22 DIAGNOSIS — R918 Other nonspecific abnormal finding of lung field: Secondary | ICD-10-CM | POA: Diagnosis present

## 2019-12-27 ENCOUNTER — Other Ambulatory Visit: Payer: Self-pay | Admitting: Specialist

## 2019-12-27 DIAGNOSIS — R918 Other nonspecific abnormal finding of lung field: Secondary | ICD-10-CM

## 2019-12-27 DIAGNOSIS — R059 Cough, unspecified: Secondary | ICD-10-CM

## 2019-12-27 DIAGNOSIS — R0600 Dyspnea, unspecified: Secondary | ICD-10-CM

## 2019-12-27 DIAGNOSIS — R0609 Other forms of dyspnea: Secondary | ICD-10-CM

## 2019-12-27 DIAGNOSIS — J479 Bronchiectasis, uncomplicated: Secondary | ICD-10-CM

## 2020-01-15 ENCOUNTER — Ambulatory Visit: Payer: Medicare Other

## 2020-03-10 ENCOUNTER — Other Ambulatory Visit: Payer: Self-pay | Admitting: Obstetrics & Gynecology

## 2020-03-10 NOTE — Telephone Encounter (Signed)
Sch follow up [place] in order to see, adjust, manage medicine regimen (hormones)

## 2020-03-12 NOTE — Telephone Encounter (Signed)
Called and left voicemail for patient to call back to be scheduled. 

## 2020-07-29 ENCOUNTER — Ambulatory Visit: Payer: Medicare Other

## 2020-08-05 ENCOUNTER — Ambulatory Visit: Payer: Medicare Other

## 2020-08-12 ENCOUNTER — Other Ambulatory Visit: Payer: Self-pay

## 2020-08-12 ENCOUNTER — Ambulatory Visit (INDEPENDENT_AMBULATORY_CARE_PROVIDER_SITE_OTHER): Payer: Medicare Other | Admitting: Dermatology

## 2020-08-12 DIAGNOSIS — L821 Other seborrheic keratosis: Secondary | ICD-10-CM

## 2020-08-12 DIAGNOSIS — D1800 Hemangioma unspecified site: Secondary | ICD-10-CM

## 2020-08-12 DIAGNOSIS — D239 Other benign neoplasm of skin, unspecified: Secondary | ICD-10-CM

## 2020-08-12 DIAGNOSIS — D225 Melanocytic nevi of trunk: Secondary | ICD-10-CM

## 2020-08-12 DIAGNOSIS — Z1283 Encounter for screening for malignant neoplasm of skin: Secondary | ICD-10-CM | POA: Diagnosis not present

## 2020-08-12 DIAGNOSIS — D229 Melanocytic nevi, unspecified: Secondary | ICD-10-CM

## 2020-08-12 DIAGNOSIS — D235 Other benign neoplasm of skin of trunk: Secondary | ICD-10-CM | POA: Diagnosis not present

## 2020-08-12 DIAGNOSIS — Z86006 Personal history of melanoma in-situ: Secondary | ICD-10-CM

## 2020-08-12 DIAGNOSIS — L820 Inflamed seborrheic keratosis: Secondary | ICD-10-CM | POA: Diagnosis not present

## 2020-08-12 DIAGNOSIS — L814 Other melanin hyperpigmentation: Secondary | ICD-10-CM

## 2020-08-12 DIAGNOSIS — L578 Other skin changes due to chronic exposure to nonionizing radiation: Secondary | ICD-10-CM

## 2020-08-12 DIAGNOSIS — Z85828 Personal history of other malignant neoplasm of skin: Secondary | ICD-10-CM

## 2020-08-12 NOTE — Patient Instructions (Addendum)
Melanoma ABCDEs  Melanoma is the most dangerous type of skin cancer, and is the leading cause of death from skin disease.  You are more likely to develop melanoma if you: Have light-colored skin, light-colored eyes, or red or blond hair Spend a lot of time in the sun Tan regularly, either outdoors or in a tanning bed Have had blistering sunburns, especially during childhood Have a close family member who has had a melanoma Have atypical moles or large birthmarks  Early detection of melanoma is key since treatment is typically straightforward and cure rates are extremely high if we catch it early.   The first sign of melanoma is often a change in a mole or a new dark spot.  The ABCDE system is a way of remembering the signs of melanoma.  A for asymmetry:  The two halves do not match. B for border:  The edges of the growth are irregular. C for color:  A mixture of colors are present instead of an even brown color. D for diameter:  Melanomas are usually (but not always) greater than 22mm - the size of a pencil eraser. E for evolution:  The spot keeps changing in size, shape, and color.  Please check your skin once per month between visits. You can use a small mirror in front and a large mirror behind you to keep an eye on the back side or your body.   If you see any new or changing lesions before your next follow-up, please call to schedule a visit.  Please continue daily skin protection including broad spectrum sunscreen SPF 30+ to sun-exposed areas, reapplying every 2 hours as needed when you're outdoors.    Cryotherapy Aftercare  Wash gently with soap and water everyday.   Apply Vaseline and Band-Aid daily until healed.   If you have any questions or concerns for your doctor, please call our main line at [phone] and press option 4 to reach your doctor's medical assistant. If no one answers, please leave a voicemail as directed and we will return your call as soon as possible.  Messages left after 4 pm will be answered the following business day.   You may also send [place] a message via [place]. We typically respond to MyChart messages within 1-2 business days.  For prescription refills, please ask your pharmacy to contact our office. Our fax number is [phone].  If you have an urgent issue when the clinic is closed that cannot wait until the next business day, you can page your doctor at the number below.    Please note that while we do our best to be available for urgent issues outside of office hours, we are not available 24/7.   If you have an urgent issue and are unable to reach [place], you may choose to seek medical care at your doctor's office, retail clinic, urgent care center, or emergency room.  If you have a medical emergency, please immediately call 911 or go to the emergency department.  Pager Numbers  - Dr. Nehemiah Massed: [phone]  - Dr. Laurence Ferrari: [phone]  - Dr. Nicole Kindred: [phone]  In the event of inclement weather, please call our main line at [phone] for an update on the status of any delays or closures.  Dermatology Medication Tips: Please keep the boxes that topical medications come in in order to help keep track of the instructions about where and how to use these. Pharmacies typically print the medication instructions only on the boxes and not directly on  the medication tubes.   If your medication is too expensive, please contact our office at [phone] option 4 or send us a message through MyChart.   We are unable to tell what your co-pay for medications will be in advance as this is different depending on your insurance coverage. However, we may be able to find a substitute medication at lower cost or fill out paperwork to get insurance to cover a needed medication.   If a prior authorization is required to get your medication covered by your insurance company, please allow us 1-2 business days to complete this process.  Drug  prices often vary depending on where the prescription is filled and some pharmacies may offer cheaper prices.  The website www.goodrx.com contains coupons for medications through different pharmacies. The prices here do not account for what the cost may be with help from insurance (it may be cheaper with your insurance), but the website can give you the price if you did not use any insurance.  - You can print the associated coupon and take it with your prescription to the pharmacy.  - You may also stop by our office during regular business hours and pick up a GoodRx coupon card.  - If you need your prescription sent electronically to a different pharmacy, notify our office through [facility] MyChart or by phone at [phone] option 4.  

## 2020-08-12 NOTE — Progress Notes (Signed)
Follow-Up Visit   Subjective  Shannon Pennington is a 80 y.o. female who presents for the following: TBSE (Patient here for full body skin exam and skin cancer screening. Patient with hx of Mmis and BCC. Patient does have a spot at left lower leg that came up and had a blister, then scabbed. Also with some irritated spots at right arm. ).  Patient accompanied by husband.   The following portions of the chart were reviewed this encounter and updated as appropriate:        Review of Systems:  No other skin or systemic complaints except as noted in HPI or Assessment and Plan.  Objective  Well appearing patient in no apparent distress; mood and affect are within normal limits.  A full examination was performed including scalp, head, eyes, ears, nose, lips, neck, chest, axillae, abdomen, back, buttocks, bilateral upper extremities, bilateral lower extremities, hands, feet, fingers, toes, fingernails, and toenails. All findings within normal limits unless otherwise noted below.  Left upper pretibia x 1, right calf x 1, right arm x 2 (4) Erythematous keratotic or waxy stuck-on papule   Spinal upper back Dilated pore/open comedone  Spinal mid upper back 74mm medium dark brown macule with notch   Left Lower Back 68mm medium dark brown macule   Assessment & Plan  Inflamed seborrheic keratosis Left upper pretibia x 1, right calf x 1, right arm x 2  Recheck left upper pretibia at follow up  Destruction of lesion - Left upper pretibia x 1, right calf x 1, right arm x 2  Destruction method: cryotherapy   Informed consent: discussed and consent obtained   Lesion destroyed using liquid nitrogen: Yes   Region frozen until ice ball extended beyond lesion: Yes   Outcome: patient tolerated procedure well with no complications   Post-procedure details: wound care instructions given   Additional details:  Prior to procedure, discussed risks of blister formation, small wound, skin  dyspigmentation, or rare scar following cryotherapy. Recommend Vaseline ointment to treated areas while healing.   Dilated pore of Winer Spinal upper back  Extracted today with CTA, may recur  Nevus (2) Left Lower Back; Spinal mid upper back  Benign-appearing.  Observation.  Call clinic for new or changing lesions.  Recommend daily use of broad spectrum spf 30+ sunscreen to sun-exposed areas.    Lentigines - Scattered tan macules - Due to sun exposure - Benign-appering, observe - Recommend daily broad spectrum sunscreen SPF 30+ to sun-exposed areas, reapply every 2 hours as needed. - Call for any changes  Seborrheic Keratoses - Stuck-on, waxy, tan-brown papules and/or plaques  - Benign-appearing - Discussed benign etiology and prognosis. - Observe - Call for any changes  Melanocytic Nevi - Tan-brown and/or pink-flesh-colored symmetric macules and papules - Benign appearing on exam today - Observation - Call clinic for new or changing moles - Recommend daily use of broad spectrum spf 30+ sunscreen to sun-exposed areas.   Hemangiomas - Red papules - Discussed benign nature - Observe - Call for any changes  Actinic Damage - Chronic condition, secondary to cumulative UV/sun exposure - diffuse scaly erythematous macules with underlying dyspigmentation - Recommend daily broad spectrum sunscreen SPF 30+ to sun-exposed areas, reapply every 2 hours as needed.  - Staying in the shade or wearing long sleeves, sun glasses (UVA+UVB protection) and wide brim hats (4-inch brim around the entire circumference of the hat) are also recommended for sun protection.  - Call for new or changing lesions.  Skin cancer screening performed today.  History of Basal Cell Carcinoma of the Skin - No evidence of recurrence today at right abdomen - Recommend regular full body skin exams - Recommend daily broad spectrum sunscreen SPF 30+ to sun-exposed areas, reapply every 2 hours as needed.  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  - Call if any new or changing lesions are noted between office visits  Return in about 1 year (around 08/12/2021) for TBSE, 6 month Recheck left upper pretibia and sun exposed.  Graciella Belton, RMA, am acting as scribe for Brendolyn Patty, MD .  Documentation: I have reviewed the above documentation for accuracy and completeness, and I agree with the above.  Brendolyn Patty MD

## 2020-08-26 ENCOUNTER — Ambulatory Visit: Payer: Medicare Other

## 2021-02-25 ENCOUNTER — Other Ambulatory Visit: Payer: Self-pay

## 2021-02-25 ENCOUNTER — Ambulatory Visit: Payer: Medicare Other | Admitting: Dermatology

## 2021-02-25 ENCOUNTER — Encounter: Payer: Self-pay | Admitting: Dermatology

## 2021-02-25 DIAGNOSIS — L719 Rosacea, unspecified: Secondary | ICD-10-CM | POA: Diagnosis not present

## 2021-02-25 DIAGNOSIS — L821 Other seborrheic keratosis: Secondary | ICD-10-CM | POA: Diagnosis not present

## 2021-02-25 DIAGNOSIS — L820 Inflamed seborrheic keratosis: Secondary | ICD-10-CM

## 2021-02-25 DIAGNOSIS — L578 Other skin changes due to chronic exposure to nonionizing radiation: Secondary | ICD-10-CM | POA: Diagnosis not present

## 2021-02-25 MED ORDER — DOXYCYCLINE 40 MG PO CPDR: 40.0000 mg | DELAYED_RELEASE_CAPSULE | ORAL | 2 refills | Status: DC

## 2021-02-25 MED ORDER — METRONIDAZOLE 0.75 % EX CREA: TOPICAL_CREAM | CUTANEOUS | 5 refills | Status: DC

## 2021-02-25 NOTE — Progress Notes (Signed)
Follow-Up Visit   Subjective  Shannon Pennington is a 81 y.o. female who presents for the following: Follow-up. Patient here for 6 month follow-up. She has a spot on her R calf that was treated last visit, but didn't go away. She also has a spot on her left shoulder and left upper abdomen that are irritating. She has a burning sensation and pinkness to her cheeks off and on for about a year. No treatment.    The following portions of the chart were reviewed this encounter and updated as appropriate:       Review of Systems:  No other skin or systemic complaints except as noted in HPI or Assessment and Plan.  Objective  Well appearing patient in no apparent distress; mood and affect are within normal limits.  A focused examination was performed including face, abdomen, legs. Relevant physical exam findings are noted in the Assessment and Plan.  face Erythema of the malar cheeks, R > L with telangiectasias.  L posterior shoulder x 1, R lower calf x 1 (2) Erythematous stuck-on, waxy papule of the left posterior shoulder;  14mm waxy flesh verrucous papule of the right lower calf     Assessment & Plan  Actinic Damage - chronic, secondary to cumulative UV radiation exposure/sun exposure over time - diffuse scaly erythematous macules with underlying dyspigmentation - Recommend daily broad spectrum sunscreen SPF 30+ to sun-exposed areas, reapply every 2 hours as needed.  - Recommend staying in the shade or wearing long sleeves, sun glasses (UVA+UVB protection) and wide brim hats (4-inch brim around the entire circumference of the hat). - Call for new or changing lesions.  Seborrheic Keratoses - Stuck-on, waxy, tan-brown papules and/or plaques, including left inframammary  - Benign-appearing - Discussed benign etiology and prognosis. - Observe - Call for any changes  Rosacea face  Flared and symptomatic  Rosacea is a chronic progressive skin condition usually affecting the face  of adults, causing redness and/or acne bumps. It is treatable but not curable. It sometimes affects the eyes (ocular rosacea) as well. It may respond to topical and/or systemic medication and can flare with stress, sun exposure, alcohol, exercise and some foods.  Daily application of broad spectrum spf 30+ sunscreen to face is recommended to reduce flares.  Start doxycycline ER 40mg  take 1 po QD with food dsp #30 5Rf Start metronidazole 0.75% cream Apply to face qd/bid dsp 45g 5Rf  Doxycycline should be taken with food to prevent nausea. Do not lay down for 30 minutes after taking. Be cautious with sun exposure and use good sun protection while on this medication. Pregnant women should not take this medication.    doxycycline (ORACEA) 40 MG capsule - face Take 1 capsule (40 mg total) by mouth every morning. Take with food.  metroNIDAZOLE (METROCREAM) 0.75 % cream - face Apply to face 1-2 times a day for rosacea.  Inflamed seborrheic keratosis (2) L posterior shoulder x 1, R lower calf x 1  vs Wart (R lower calf)  Destruction of lesion - L posterior shoulder x 1, R lower calf x 1  Destruction method: cryotherapy   Informed consent: discussed and consent obtained   Lesion destroyed using liquid nitrogen: Yes   Region frozen until ice ball extended beyond lesion: Yes   Outcome: patient tolerated procedure well with no complications   Post-procedure details: wound care instructions given   Additional details:  Prior to procedure, discussed risks of blister formation, small wound, skin dyspigmentation, or rare  scar following cryotherapy. Recommend Vaseline ointment to treated areas while healing.    Return in about 3 months (around 05/26/2021) for Rosacea, wart R calf.  IJamesetta Orleans, CMA, am acting as scribe for Brendolyn Patty, MD . Documentation: I have reviewed the above documentation for accuracy and completeness, and I agree with the above.  Brendolyn Patty MD

## 2021-02-25 NOTE — Patient Instructions (Addendum)
Cryotherapy Aftercare  Wash gently with soap and water everyday.   Apply Vaseline and Band-Aid daily until healed.   Start Doxycycline 40mg  take 1 pill by mouth daily for rosacea. Doxycycline should be taken with food to prevent nausea. Do not lay down for 30 minutes after taking. Be cautious with sun exposure and use good sun protection while on this medication. Pregnant women should not take this medication.   Start metronidazole 0.75% cream Apply to face once a day, twice when flared, for rosacea.    Rosacea  What is rosacea? Rosacea (say: ro-zay-sha) is a common skin disease that usually begins as a trend of flushing or blushing easily.  As rosacea progresses, a persistent redness in the center of the face will develop and may gradually spread beyond the nose and cheeks to the forehead and chin.  In some cases, the ears, chest, and back could be affected.  Rosacea may appear as tiny blood vessels or small red bumps that occur in crops.  Frequently they can contain pus, and are called pustules.  If the bumps do not contain pus, they are referred to as papules.  Rarely, in prolonged, untreated cases of rosacea, the oil glands of the nose and cheeks may become permanently enlarged.  This is called rhinophyma, and is seen more frequently in men.  Signs and Risks In its beginning stages, rosacea tends to come and go, which makes it difficult to recognize.  It can start as intermittent flushing of the face.  Eventually, blood vessels may become permanently visible.  Pustules and papules can appear, but can be mistaken for adult acne.  People of all races, ages, genders and ethnic groups are at risk of developing rosacea.  However, it is more common in women (especially around menopause) and adults with fair skin between the ages of 52 and 76.  Treatment Dermatologists typically recommend a combination of treatments to effectively manage rosacea.  Treatment can improve symptoms and may stop the  progression of the rosacea.  Treatment may involve both topical and oral medications.  The tetracycline antibiotics are often used for their anti-inflammatory effect; however, because of the possibility of developing antibiotic resistance, they should not be used long term at full dose.  For dilated blood vessels the options include electrodessication (uses electric current through a small needle), laser treatment, and cosmetics to hide the redness.   With all forms of treatment, improvement is a slow process, and patients may not see any results for the first 3-4 weeks.  It is very important to avoid the sun and other triggers.  Patients must wear sunscreen daily.  Skin Care Instructions: Cleanse the skin with a mild soap such as CeraVe cleanser, Cetaphil cleanser, or Dove soap once or twice daily as needed. Moisturize with Eucerin Redness Relief Daily Perfecting Lotion (has a subtle green tint), CeraVe Moisturizing Cream (CeraVe AM and PM), or Oil of Olay Daily Moisturizer with sunscreen every morning and/or night as recommended. Makeup should be non-comedogenic (wont clog pores) and be labeled for sensitive skin. Good choices for cosmetics are: Neutrogena, Almay, and Physicians Formula.  Any product with a green tint tends to offset a red complexion. If your eyes are dry and irritated, use artificial tears 2-3 times per day and cleanse the eyelids daily with baby shampoo.  Have your eyes examined at least every 2 years.  Be sure to tell your eye doctor that you have rosacea. Alcoholic beverages tend to cause flushing of the skin, and  may make rosacea worse. Always wear sunscreen, protect your skin from extreme hot and cold temperatures, and avoid spicy foods, hot drinks, and mechanical irritation such as rubbing, scrubbing, or massaging the face.  Avoid harsh skin cleansers, cleansing masks, astringents, and exfoliation. If a particular product burns or makes your face feel tight, then it is likely  to flare your rosacea. If you are having difficulty finding a sunscreen that you can tolerate, you may try switching to a chemical-free sunscreen.  These are ones whose active ingredient is zinc oxide or titanium dioxide only.  They should also be fragrance free, non-comedogenic, and labeled for sensitive skin. Rosacea triggers may vary from person to person.  There are a variety of foods that have been reported to trigger rosacea.  Some patients find that keeping a diary of what they were doing when they flared helps them avoid triggers.   If You Need Anything After Your Visit  If you have any questions or concerns for your doctor, please call our main line at [phone] and press option 4 to reach your doctor's medical assistant. If no one answers, please leave a voicemail as directed and we will return your call as soon as possible. Messages left after 4 pm will be answered the following business day.   You may also send [place] a message via [place]. We typically respond to MyChart messages within 1-2 business days.  For prescription refills, please ask your pharmacy to contact our office. Our fax number is [phone].  If you have an urgent issue when the clinic is closed that cannot wait until the next business day, you can page your doctor at the number below.    Please note that while we do our best to be available for urgent issues outside of office hours, we are not available 24/7.   If you have an urgent issue and are unable to reach [place], you may choose to seek medical care at your doctor's office, retail clinic, urgent care center, or emergency room.  If you have a medical emergency, please immediately call 911 or go to the emergency department.  Pager Numbers  - Dr. Nehemiah Massed: [phone]  - Dr. Laurence Ferrari: [phone]  - Dr. Nicole Kindred: [phone]  In the event of inclement weather, please call our main line at [phone] for an update on the status of any delays or  closures.  Dermatology Medication Tips: Please keep the boxes that topical medications come in in order to help keep track of the instructions about where and how to use these. Pharmacies typically print the medication instructions only on the boxes and not directly on the medication tubes.   If your medication is too expensive, please contact our office at [phone] option 4 or send [place] a message through [place].   We are unable to tell what your co-pay for medications will be in advance as this is different depending on your insurance coverage. However, we may be able to find a substitute medication at lower cost or fill out paperwork to get insurance to cover a needed medication.   If a prior authorization is required to get your medication covered by your insurance company, please allow [place] 1-2 business days to complete this process.  Drug prices often vary depending on where the prescription is filled and some pharmacies may offer cheaper prices.  The website www.goodrx.com contains coupons for medications through different pharmacies. The prices here do not account for what the cost may be with help from insurance (  it may be cheaper with your insurance), but the website can give you the price if you did not use any insurance.  - You can print the associated coupon and take it with your prescription to the pharmacy.  - You may also stop by our office during regular business hours and pick up a GoodRx coupon card.  - If you need your prescription sent electronically to a different pharmacy, notify our office through [facility] or by phone at [phone] option 4.     Si Usted Necesita Algo Despus de Su Visita  Tambin puede enviarnos un mensaje a travs de Pharmacist, community. Por lo general respondemos a los mensajes de MyChart en el transcurso de 1 a 2 das hbiles.  Para renovar recetas, por favor pida a su farmacia que se ponga en contacto con nuestra oficina. Harland Dingwall de fax  es [place] [phone].  Si tiene un asunto urgente cuando la clnica est cerrada y que no puede esperar hasta el siguiente da hbil, puede llamar/localizar a su doctor(a) al nmero que aparece a continuacin.   Por favor, tenga en cuenta que aunque hacemos todo lo posible para estar disponibles para asuntos urgentes fuera del horario de [place], no estamos disponibles las 24 horas del da, los 7 das de la [place].   Si tiene un problema urgente y no puede comunicarse con nosotros, puede optar por buscar atencin mdica  en el consultorio de su doctor(a), en una clnica privada, en un centro de atencin urgente o en una sala de emergencias.  Si tiene Engineering geologist, por favor llame inmediatamente al 911 o vaya a la sala de emergencias.  Nmeros de bper  - Dr. Nehemiah Massed: [phone]  - Dra. Moye: [phone]  - Dra. Nicole Kindred: [phone]  En caso de inclemencias del [place], por favor llame a Johnsie Kindred principal al [phone] para una actualizacin sobre el [place] de cualquier retraso o cierre.  Consejos para la medicacin en dermatologa: Por favor, guarde las cajas en las que vienen los medicamentos de uso tpico para ayudarle a seguir las instrucciones sobre dnde y cmo usarlos. Las farmacias generalmente imprimen las instrucciones del medicamento slo en las cajas y no directamente en los tubos del [place].   Si su medicamento es muy caro, por favor, pngase en contacto con Zigmund Daniel llamando al [phone] y presione la opcin 4 o envenos un mensaje a travs de Pharmacist, community.   No podemos decirle cul ser su copago por los medicamentos por adelantado ya que esto es diferente dependiendo de la cobertura de su seguro. Sin embargo, es posible que podamos encontrar un medicamento sustituto a Electrical engineer un formulario para que el seguro cubra el medicamento que se considera necesario.   Si se requiere una autorizacin previa para que su compaa de seguros [place]  su medicamento, por favor permtanos de 1 a 2 das hbiles para completar este proceso.  Los precios de los medicamentos varan con frecuencia dependiendo del Environmental consultant de dnde se surte la receta y alguna farmacias pueden ofrecer precios ms baratos.  El sitio web www.goodrx.com tiene cupones para medicamentos de Airline pilot. Los precios aqu no tienen en cuenta lo que podra costar con la ayuda del seguro (puede ser ms barato con su seguro), pero el sitio web puede darle el precio si no utiliz Research scientist (physical sciences).  - Puede imprimir el cupn correspondiente y llevarlo con su receta a la farmacia.  - Tambin puede pasar por nuestra oficina durante el horario de atencin  regular y recoger una tarjeta de cupones de GoodRx.  - Si necesita que su receta se enve electrnicamente a una farmacia diferente, informe a nuestra oficina a travs de MyChart de [place] o por telfono llamando al [phone] y presione la opcin 4.

## 2021-06-02 ENCOUNTER — Ambulatory Visit: Payer: Medicare Other | Admitting: Dermatology

## 2021-06-05 ENCOUNTER — Ambulatory Visit: Payer: Medicare Other | Admitting: Urology

## 2021-06-10 NOTE — Progress Notes (Signed)
? ?06/11/21 ?8:51 AM  ? ?Shannon Pennington ?01-17-1941 ?[id number] ? ?Referring provider:  ?Adin Hector, MD ?[facility]- ?[place],  [place] [address] ?Chief Complaint  ?Patient presents with  ? Urinary Tract Infection  ? ? ? ?HPI: ?Shannon Pennington is a 81 y.o.female who presents today further evaluation of possible UTI.  ? ?She has a personal history of rUTIs, urinary frequency, and vaginal atrophy.  ? ?She was last seen in clinic by Zara Council, PA-C on 09/07/2017. ? ?She reports that she self treated with Cipro that she had at her house earlier this month for a UTI that she had symptoms of urinary frequency/ urgency.  She failed to improve. ? ?She called her PCP and they placed her on doxycycline for an infection she had on the 12th.  Her symptoms quickly resolved after a total of 5 days of treatment on this medication.  She had no UA or urine culture. ? ?Urinary tract infections are fairly infrequent.  She has not been using topical estrogen cream. ? ?PMH: ?Past Medical History:  ?Diagnosis Date  ? Anxiety   ? Basal cell carcinoma 12/17/2015  ? R abdomen   ? GERD (gastroesophageal reflux disease)   ? Hyperlipidemia   ? Hypertension   ? Insomnia   ? Melanoma ([place]) 05/17/2009  ? 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 Osteopenia   ? Tachycardia   ? Thyroid disease   ? ? ?Surgical History: ?Past Surgical History:  ?Procedure Laterality Date  ? MELANOMA EXCISION Left 05/2009  ? [facility] (Arm)  ? TONSILLECTOMY    ? ? ?Home Medications:  ?Allergies as of 06/11/2021   ? ?   Reactions  ? Amoxicillin-pot Clavulanate Hives  ? Levofloxacin   ? Other reaction(s): Other (See Comments) ?tremor  ? Sulfa Antibiotics Other (See Comments)  ? G.I upset  ? ?  ? ?  ?Medication List  ?  ? ?  ? Accurate as of June 11, 2021 11:59 PM. If you have any questions, ask your nurse or doctor.  ?  ?  ? ?  ? ?STOP taking these medications   ? ?azithromycin 250 MG tablet ?Commonly  known as: ZITHROMAX ?  ?doxycycline 40 MG capsule ?Commonly known as: ORACEA ?  ?metroNIDAZOLE 0.75 % cream ?Commonly known as: METROCREAM ?  ? ?  ? ?TAKE these medications   ? ?atorvastatin 10 MG tablet ?Commonly known as: LIPITOR ?Take 1 tablet by mouth daily. ?What changed: Another medication with the same name was removed. Continue taking this medication, and follow the directions you see here. ?  ?estradiol 1 MG tablet ?Commonly known as: ESTRACE ?TAKE 1 TABLET BY MOUTH EVERY DAY ?  ?hydrochlorothiazide 12.5 MG tablet ?Commonly known as: HYDRODIURIL ?Take 12.5 mg by mouth daily. ?What changed: Another medication with the same name was removed. Continue taking this medication, and follow the directions you see here. ?  ?levocetirizine 5 MG tablet ?Commonly known as: XYZAL ?Take 1 tablet by mouth every evening. ?What changed: Another medication with the same name was removed. Continue taking this medication, and follow the directions you see here. ?  ?levothyroxine 50 MCG tablet ?Commonly known as: SYNTHROID ?Take 50 mcg by mouth daily. 1/2 tablet ?What changed: Another medication with the same name was removed. Continue taking this medication, and follow the directions you see here. ?  ?levothyroxine 75 MCG tablet ?Commonly known as: SYNTHROID ?Take by mouth. ?What  changed: Another medication with the same name was removed. Continue taking this medication, and follow the directions you see here. ?  ?LORazepam 0.5 MG tablet ?Commonly known as: ATIVAN ?take 1 tablet by mouth once daily ?What changed: Another medication with the same name was removed. Continue taking this medication, and follow the directions you see here. ?  ?medroxyPROGESTERone 2.5 MG tablet ?Commonly known as: PROVERA ?TAKE 1 TABLET BY MOUTH EVERY DAY ?  ?metoprolol succinate 25 MG 24 hr tablet ?Commonly known as: TOPROL-XL ?take 1/2 tablet by mouth once daily ?  ?temazepam 15 MG capsule ?Commonly known as: RESTORIL ?take 1/2 to 1 capsule by  mouth at bedtime if needed ?  ? ?  ? ? ?Allergies:  ?Allergies  ?Allergen Reactions  ? Amoxicillin-Pot Clavulanate Hives  ? Levofloxacin   ?  Other reaction(s): Other (See Comments) ?tremor  ? Sulfa Antibiotics Other (See Comments)  ?  G.I upset  ? ? ?Family History: ?Family History  ?Problem Relation Age of Onset  ? Cancer Father   ?     lung  ? Cancer Brother   ?     lung  ? Bladder Cancer Neg Hx   ? Prostate cancer Neg Hx   ? Kidney cancer Neg Hx   ? ? ?Social History:  reports that she quit smoking about 31 years ago. She has never used smokeless tobacco. She reports that she does not drink alcohol and does not use drugs. ? ? ?Physical Exam: ?BP (!) 148/80   Pulse 68   Ht '5\' 3"'$  (1.6 m)   Wt 113 lb (51.3 kg)   BMI 20.02 kg/m?   ?Constitutional:  Alert and oriented, No acute distress. ?HEENT: [place] AT, moist mucus membranes.  Trachea midline, no masses. ?Cardiovascular: No clubbing, cyanosis, or edema. ?Respiratory: Normal respiratory effort, no increased work of breathing. ?Skin: No rashes, bruises or suspicious lesions. ?Neurologic: Grossly intact, no focal deficits, moving all 4 extremities. ?Psychiatric: Normal mood and affect. ? ?Laboratory Data: ? ?Lab Results  ?Component Value Date  ? CREATININE 1.30 (H) 10/06/2019  ? ? ?Urinalysis  ?Negative, see epic ? ?Assessment & Plan:   ?Recurrent UTIs  ?-Urinary tract symptoms have now resolved and her urinalysis today is clear which is reassuring ?- We discussed addition of vaginal estrogen cream to her regimen, was previously using this but no longer using it.  Given her fairly infrequent episodes, will hold off for the time being ?-  Counseled her in UTI prevention supplements such as cranberry tablets, probiotics, yogurt that has active lactobacillus culture, and d-mannose  ?-Advised now that she is reestablished in our practice, or happy to see her for urgent same or next day appointments if she has UTI symptoms.  May consider resumption of topical estrogen  cream if her frequency of infections increases. ?-Antibiotic stewardship was discussed today,  discourage self treatment. ? ?F/u  prn ? ?Conley Rolls as a scribe for Hollice Espy, MD.,have documented all relevant documentation on the behalf of Hollice Espy, MD,as directed by  Hollice Espy, MD while in the presence of Hollice Espy, MD. ? ?I have reviewed the above documentation for accuracy and completeness, and I agree with the above.  ? ?Hollice Espy, MD ? ? ?[facility] ?[address], [address] ?[place], [place] [address] ?[phone]

## 2021-06-11 ENCOUNTER — Ambulatory Visit: Payer: Medicare Other | Admitting: Urology

## 2021-06-11 VITALS — BP 148/80 | HR 68 | Ht 63.0 in | Wt 113.0 lb

## 2021-06-11 DIAGNOSIS — R399 Unspecified symptoms and signs involving the genitourinary system: Secondary | ICD-10-CM

## 2021-06-11 DIAGNOSIS — Z87440 Personal history of urinary (tract) infections: Secondary | ICD-10-CM

## 2021-06-11 DIAGNOSIS — N952 Postmenopausal atrophic vaginitis: Secondary | ICD-10-CM

## 2021-06-11 DIAGNOSIS — R350 Frequency of micturition: Secondary | ICD-10-CM

## 2021-06-11 LAB — MICROSCOPIC EXAMINATION: Bacteria, UA: NONE SEEN

## 2021-06-11 LAB — URINALYSIS, COMPLETE
Bilirubin, UA: NEGATIVE
Glucose, UA: NEGATIVE
Ketones, UA: NEGATIVE
Leukocytes,UA: NEGATIVE
Nitrite, UA: NEGATIVE
Protein,UA: NEGATIVE
RBC, UA: NEGATIVE
Specific Gravity, UA: 1.02 (ref 1.005–1.030)
Urobilinogen, Ur: 1 mg/dL (ref 0.2–1.0)
pH, UA: 7 (ref 5.0–7.5)

## 2021-06-11 NOTE — Patient Instructions (Signed)
Please consider taking daily cranberry tablets, D-mannose, and daily probiotics ?

## 2021-08-12 ENCOUNTER — Encounter: Payer: Self-pay | Admitting: Dermatology

## 2021-08-12 ENCOUNTER — Ambulatory Visit: Payer: Medicare Other | Admitting: Dermatology

## 2021-08-12 DIAGNOSIS — D239 Other benign neoplasm of skin, unspecified: Secondary | ICD-10-CM

## 2021-08-12 DIAGNOSIS — L719 Rosacea, unspecified: Secondary | ICD-10-CM

## 2021-08-12 DIAGNOSIS — Z1283 Encounter for screening for malignant neoplasm of skin: Secondary | ICD-10-CM | POA: Diagnosis not present

## 2021-08-12 DIAGNOSIS — B078 Other viral warts: Secondary | ICD-10-CM

## 2021-08-12 DIAGNOSIS — L820 Inflamed seborrheic keratosis: Secondary | ICD-10-CM | POA: Diagnosis not present

## 2021-08-12 DIAGNOSIS — D235 Other benign neoplasm of skin of trunk: Secondary | ICD-10-CM

## 2021-08-12 DIAGNOSIS — D225 Melanocytic nevi of trunk: Secondary | ICD-10-CM

## 2021-08-12 DIAGNOSIS — L578 Other skin changes due to chronic exposure to nonionizing radiation: Secondary | ICD-10-CM

## 2021-08-12 DIAGNOSIS — L814 Other melanin hyperpigmentation: Secondary | ICD-10-CM

## 2021-08-12 DIAGNOSIS — L821 Other seborrheic keratosis: Secondary | ICD-10-CM

## 2021-08-12 DIAGNOSIS — D229 Melanocytic nevi, unspecified: Secondary | ICD-10-CM

## 2021-08-12 DIAGNOSIS — Z85828 Personal history of other malignant neoplasm of skin: Secondary | ICD-10-CM

## 2021-08-12 DIAGNOSIS — Z86006 Personal history of melanoma in-situ: Secondary | ICD-10-CM

## 2021-08-12 DIAGNOSIS — D1800 Hemangioma unspecified site: Secondary | ICD-10-CM

## 2021-08-12 MED ORDER — IVERMECTIN 1 % EX CREA: 1.0000 | TOPICAL_CREAM | Freq: Every day | CUTANEOUS | 6 refills | Status: DC

## 2021-08-12 NOTE — Progress Notes (Signed)
Follow-Up Visit   Subjective  Shannon Pennington is a 81 y.o. female who presents for the following: Annual Exam (History of Melanoma in situ - The patient presents for Total-Body Skin Exam (TBSE) for skin cancer screening and mole check.  The patient has spots, moles and lesions to be evaluated, some may be new or changing and the patient has concerns that these could be cancer./) and Rosacea (Treating with Metronidazole 0.75% cream - she tried to take Doxycycline but had a lot of GI upset while taking it so she stopped).  Accompanied by husband  The following portions of the chart were reviewed this encounter and updated as appropriate:       Review of Systems:  No other skin or systemic complaints except as noted in HPI or Assessment and Plan.  Objective  Well appearing patient in no apparent distress; mood and affect are within normal limits.  A full examination was performed including scalp, head, eyes, ears, nose, lips, neck, chest, axillae, abdomen, back, buttocks, bilateral upper extremities, bilateral lower extremities, hands, feet, fingers, toes, fingernails, and toenails. All findings within normal limits unless otherwise noted below.  Face Erythema with telangiectasias and a couple of inflammatory papules of malar cheeks  Right Upper Back Dilated pore  Left lat lower back, Left spinal lower back 3 mm two toned brown macule of left spinal lower back, darker edge  2 mm medium dark brown macule of left lat lower back   Right lower calf Keratotic papule 4mm  Left chest Erythematous stuck-on, waxy papule with focal pigmentation    Assessment & Plan   History of Melanoma in Situ 2011 - No evidence of recurrence today L upper arm - Recommend regular full body skin exams - Recommend daily broad spectrum sunscreen SPF 30+ to sun-exposed areas, reapply every 2 hours as needed.  - Call if any new or changing lesions are noted between office visits  History of Basal Cell  Carcinoma of the Skin 2017 - No evidence of recurrence today R abdomen - Recommend regular full body skin exams - Recommend daily broad spectrum sunscreen SPF 30+ to sun-exposed areas, reapply every 2 hours as needed.  - Call if any new or changing lesions are noted between office visits   Lentigines - Scattered tan macules - Due to sun exposure - Benign-appearing, observe - Recommend daily broad spectrum sunscreen SPF 30+ to sun-exposed areas, reapply every 2 hours as needed. - Call for any changes  Seborrheic Keratoses - Stuck-on, waxy, tan-brown papules and/or plaques  - Benign-appearing - Discussed benign etiology and prognosis. - Observe - Call for any changes  Melanocytic Nevi - Tan-brown and/or pink-flesh-colored symmetric macules and papules - Benign appearing on exam today - Observation - Call clinic for new or changing moles - Recommend daily use of broad spectrum spf 30+ sunscreen to sun-exposed areas.   Hemangiomas - Red papules - Discussed benign nature - Observe - Call for any changes  Actinic Damage - Chronic condition, secondary to cumulative UV/sun exposure - diffuse scaly erythematous macules with underlying dyspigmentation - Recommend daily broad spectrum sunscreen SPF 30+ to sun-exposed areas, reapply every 2 hours as needed.  - Staying in the shade or wearing long sleeves, sun glasses (UVA+UVB protection) and wide brim hats (4-inch brim around the entire circumference of the hat) are also recommended for sun protection.  - Call for new or changing lesions.  Skin cancer screening performed today.  Rosacea Face  Chronic and persistent condition with  duration or expected duration over one year. Condition is symptomatic/ bothersome to patient. Not currently at goal.   Rosacea is a chronic progressive skin condition usually affecting the face of adults, causing redness and/or acne bumps. It is treatable but not curable. It sometimes affects the eyes  (ocular rosacea) as well. It may respond to topical and/or systemic medication and can flare with stress, sun exposure, alcohol, exercise and some foods.  Daily application of broad spectrum spf 30+ sunscreen to face is recommended to reduce flares.  Start Soolantra cream qhs. Continue Metronidazole 0.75% cream qam Unable to tolerate low dose doxy due to GI upset  Ivermectin (SOOLANTRA) 1 % CREA - Face Apply 1 Application topically at bedtime.  Dilated pore of [place] Right Upper Back  Benign, Observe  Nevus (2) Left lat lower back; Left spinal lower back  Benign-appearing.  Observation.  Call clinic for new or changing lesions.  Recommend daily use of broad spectrum spf 30+ sunscreen to sun-exposed areas.    Other viral warts Right lower calf  Discussed viral etiology and risk of spread.  Discussed multiple treatments may be required to clear warts.  Discussed possible post-treatment dyspigmentation and risk of recurrence.   Destruction of lesion - Right lower calf  Destruction method: cryotherapy   Informed consent: discussed and consent obtained   Lesion destroyed using liquid nitrogen: Yes   Region frozen until ice ball extended beyond lesion: Yes   Outcome: patient tolerated procedure well with no complications   Post-procedure details: wound care instructions given   Additional details:  Prior to procedure, discussed risks of blister formation, small wound, skin dyspigmentation, or rare scar following cryotherapy. Recommend Vaseline ointment to treated areas while healing.   Inflamed seborrheic keratosis Left chest  Recheck on f/up  Destruction of lesion - Left chest  Destruction method: cryotherapy   Informed consent: discussed and consent obtained   Timeout:  patient name, date of birth, surgical site, and procedure verified Lesion destroyed using liquid nitrogen: Yes   Region frozen until ice ball extended beyond lesion: Yes   Outcome: patient tolerated procedure  well with no complications   Post-procedure details: wound care instructions given   Additional details:  Prior to procedure, discussed risks of blister formation, small wound, skin dyspigmentation, or rare scar following cryotherapy. Recommend Vaseline ointment to treated areas while healing.    Return in about 6 months (around 02/11/2022) for Wart and ISK follow up, recheck nevi.  I, Joanie Coddington, CMA, am acting as scribe for Willeen Niece, MD .  Documentation: I have reviewed the above documentation for accuracy and completeness, and I agree with the above.  Willeen Niece MD

## 2021-10-27 NOTE — Progress Notes (Unsigned)
10/28/2021 7:00 PM   [name] 07-May-1940 [id number]  Referring provider: Adin Hector, MD [facility] [place],  [place] [address]  Urological history: 1. rUTI's -Contributing factors of age, constipation and vaginal atrophy -Documented positive urine cultures over the last year  None  2.  Urethral caruncle  3. Renal cyst -RUS (2018) - cyst in the lower pole left kidney measuring 0.8 x 0.7 x 0.9 cm    No chief complaint on file.   HPI: Shannon Pennington is a 81 y.o. female who presents today for pressure in the bladder, frequent urination and possible UTI.  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 If you have any questions, ask your nurse or doctor.          atorvastatin 10 MG tablet Commonly known as: LIPITOR Take 1 tablet by mouth daily.   estradiol 1 MG tablet Commonly known as: ESTRACE TAKE 1 TABLET BY MOUTH EVERY DAY   hydrochlorothiazide 12.5 MG tablet Commonly known as: HYDRODIURIL Take 12.5 mg by mouth daily.   Ivermectin 1 % Crea Commonly known as: Soolantra Apply 1 Application topically at bedtime.   levocetirizine 5 MG tablet Commonly  known as: XYZAL Take 1 tablet by mouth every evening.   levothyroxine 50 MCG tablet Commonly known as: SYNTHROID Take 50 mcg by mouth daily. 1/2 tablet   levothyroxine 75 MCG tablet Commonly known as: SYNTHROID Take by mouth.   LORazepam 0.5 MG tablet Commonly known as: ATIVAN take 1 tablet by mouth once daily   medroxyPROGESTERone 2.5 MG tablet Commonly known as: PROVERA TAKE 1 TABLET BY MOUTH EVERY DAY   metoprolol succinate 25 MG 24 hr tablet Commonly known as: TOPROL-XL take 1/2 tablet by mouth once daily   temazepam 15 MG capsule Commonly known as: RESTORIL take 1/2 to 1 capsule by mouth at bedtime if needed        Allergies:  Allergies  Allergen Reactions   Amoxicillin-Pot Clavulanate Hives   Levofloxacin     Other reaction(s): Other (See Comments) tremor   Sulfa Antibiotics Other (See Comments)    G.I upset    Family History: Family History  Problem Relation Age of Onset   Cancer Father        lung   Cancer Brother        lung   Bladder Cancer Neg Hx    Prostate cancer Neg Hx    Kidney cancer Neg Hx  Social History:  reports that she quit smoking about 31 years ago. She has never used smokeless tobacco. She reports that she does not drink alcohol and does not use drugs.  ROS: Pertinent ROS in HPI  Physical Exam: There were no vitals taken for this visit.  Constitutional:  Well nourished. Alert and oriented, No acute distress. HEENT: [place] AT, moist mucus membranes.  Trachea midline, no masses. Cardiovascular: No clubbing, cyanosis, or edema. Respiratory: Normal respiratory effort, no increased work of breathing. GI: Abdomen is soft, non tender, non distended, no abdominal masses. Liver and spleen not palpable.  No hernias appreciated.  Stool sample for occult testing is not indicated.   GU: No CVA tenderness.  No bladder fullness or masses.  *** external genitalia, *** pubic hair distribution, no lesions.  Normal urethral meatus, no lesions, no  prolapse, no discharge.   No urethral masses, tenderness and/or tenderness. No bladder fullness, tenderness or masses. *** vagina mucosa, *** estrogen effect, no discharge, no lesions, *** pelvic support, *** cystocele and *** rectocele noted.  No cervical motion tenderness.  Uterus is freely mobile and non-fixed.  No adnexal/parametria masses or tenderness noted.  Anus and perineum are without rashes or lesions.   ***  Skin: No rashes, bruises or suspicious lesions. Lymph: No cervical or inguinal adenopathy. Neurologic: Grossly intact, no focal deficits, moving all 4 extremities. Psychiatric: Normal mood and affect.    Laboratory Data: Glucose 70 - 110 mg/dL 94   Sodium 136 - 145 mmol/L 138   Potassium 3.6 - 5.1 mmol/L 4.1   Chloride 97 - 109 mmol/L 100   Carbon Dioxide (CO2) 22.0 - 32.0 mmol/L 31.4   Urea Nitrogen (BUN) 7 - 25 mg/dL 14   Creatinine 0.6 - 1.1 mg/dL 1.1   Glomerular Filtration Rate (eGFR), MDRD Estimate >60 mL/min/1.73sq m 48 Low    Calcium 8.7 - 10.3 mg/dL 9.2   AST  8 - 39 U/L 19   ALT  5 - 38 U/L 17   Alk Phos (alkaline Phosphatase) 34 - 104 U/L 62   Albumin 3.5 - 4.8 g/dL 4.2   Bilirubin, Total 0.3 - 1.2 mg/dL 0.7   Protein, Total 6.1 - 7.9 g/dL 6.3   A/G Ratio 1.0 - 5.0 gm/dL 2.0   Resulting Agency  [facility]   Specimen Collected: 08/25/21 08:12   Performed by: [facility] Last Resulted: 08/25/21 10:15  Received From: [facility]  Result Received: 10/27/21 19:00   WBC (White Blood Cell Count) 4.1 - 10.2 10^3/uL 6.2   RBC (Red Blood Cell Count) 4.04 - 5.48 10^6/uL 4.91   Hemoglobin 12.0 - 15.0 gm/dL 15.3 High    Hematocrit 35.0 - 47.0 % 46.5   MCV (Mean Corpuscular Volume) 80.0 - 100.0 fl 94.7   MCH (Mean Corpuscular Hemoglobin) 27.0 - 31.2 pg 31.2   MCHC (Mean Corpuscular Hemoglobin Concentration) 32.0 - 36.0 gm/dL 32.9   Platelet Count 150 - 450 10^3/uL 254   RDW-CV (Red Cell Distribution Width) 11.6 - 14.8  % 13.2   MPV (Mean Platelet Volume) 9.4 - 12.4 fl 10.5   Neutrophils 1.50 - 7.80 10^3/uL 3.54   Lymphocytes 1.00 - 3.60 10^3/uL 1.79   Monocytes 0.00 - 1.50 10^3/uL 0.66   Eosinophils 0.00 - 0.55 10^3/uL 0.13   Basophils 0.00 - 0.09 10^3/uL 0.02   Neutrophil % 32.0 - 70.0 % 57.6   Lymphocyte % 10.0 - 50.0 % 29.1   Monocyte %  4.0 - 13.0 % 10.7   Eosinophil % 1.0 - 5.0 % 2.1   Basophil% 0.0 - 2.0 % 0.3   Immature Granulocyte % <=0.7 % 0.2   Immature Granulocyte Count <=0.06 10^3/L 0.01   Resulting Agency  [facility]   Specimen Collected: 08/25/21 08:12   Performed by: [facility]: 08/25/21 09:29  Received From: [facility]  Result Received: 10/27/21 19:00   Urinalysis ***   I have reviewed the labs.   Pertinent Imaging: ***  Assessment & Plan:  ***  1. Suspected UTI -UA *** -urine culture -***  No follow-ups on file.  These notes generated with voice recognition software. I apologize for typographical errors.  [address], [place] [address]  [place], [place] [address] [phone]

## 2021-10-28 ENCOUNTER — Ambulatory Visit: Payer: Medicare Other | Admitting: Urology

## 2021-10-28 ENCOUNTER — Encounter: Payer: Self-pay | Admitting: Urology

## 2021-10-28 VITALS — BP 151/60 | HR 60 | Ht 63.0 in | Wt 113.0 lb

## 2021-10-28 DIAGNOSIS — R3129 Other microscopic hematuria: Secondary | ICD-10-CM

## 2021-10-28 DIAGNOSIS — R350 Frequency of micturition: Secondary | ICD-10-CM | POA: Diagnosis not present

## 2021-10-28 DIAGNOSIS — N362 Urethral caruncle: Secondary | ICD-10-CM | POA: Diagnosis not present

## 2021-10-28 DIAGNOSIS — R3989 Other symptoms and signs involving the genitourinary system: Secondary | ICD-10-CM

## 2021-10-28 DIAGNOSIS — N3289 Other specified disorders of bladder: Secondary | ICD-10-CM | POA: Diagnosis not present

## 2021-10-28 LAB — URINALYSIS, COMPLETE
Bilirubin, UA: NEGATIVE
Glucose, UA: NEGATIVE
Ketones, UA: NEGATIVE
Nitrite, UA: NEGATIVE
Protein,UA: NEGATIVE
Specific Gravity, UA: 1.02 (ref 1.005–1.030)
Urobilinogen, Ur: 0.2 mg/dL (ref 0.2–1.0)
pH, UA: 5 (ref 5.0–7.5)

## 2021-10-28 LAB — MICROSCOPIC EXAMINATION

## 2021-10-28 LAB — BLADDER SCAN AMB NON-IMAGING

## 2021-10-31 ENCOUNTER — Telehealth: Payer: Self-pay

## 2021-10-31 LAB — CULTURE, URINE COMPREHENSIVE

## 2021-10-31 MED ORDER — NITROFURANTOIN MONOHYD MACRO 100 MG PO CAPS: 100.0000 mg | ORAL_CAPSULE | Freq: Two times a day (BID) | ORAL | 0 refills | Status: AC

## 2021-10-31 NOTE — Telephone Encounter (Signed)
Notified pt as advised. Sent Rx to pts pharmacy. She expressed understanding.

## 2021-10-31 NOTE — Telephone Encounter (Signed)
Patient's husband called to report intolerance to an antibiotic prescribed by PCP but did not remember the name. Recommended to speak with PCP and pharmacist.    Voiced understanding.

## 2021-10-31 NOTE — Telephone Encounter (Signed)
-----   Message from Nori Riis, PA-C sent at 10/31/2021  8:15 AM EDT ----- Please let Mrs. Karrer know that her urine culture was positive and we need to start an antibiotic, Macrobid 100 mg, twice daily for seven days.

## 2021-11-11 ENCOUNTER — Ambulatory Visit: Admission: RE | Admit: 2021-11-11 | Payer: Medicare Other | Source: Ambulatory Visit

## 2021-11-20 ENCOUNTER — Ambulatory Visit: Payer: Medicare Other | Admitting: Urology

## 2021-11-24 ENCOUNTER — Telehealth: Payer: Self-pay | Admitting: Urology

## 2021-11-24 NOTE — Telephone Encounter (Signed)
Patient's husband returned the call and appt made for 10/10 at 10:30am for a cath UA and office visit with [facility].

## 2021-11-24 NOTE — Telephone Encounter (Signed)
Spoke w pt's husband (ok per DPR), advised him of Shannons msg. Husband states pt has feeling of pressure while she is sitting. She was burning some on Saturday but not today. He also denies the pt experiencing f/c/n/v. Husband states pt is weak but can still get out of bed. He goes on to say pt seeking Abx to have on hand incase she starts hurting again. Advised husband [facility] will not give Abx Rx without urine sample and pt may not drop off urine sample, must be seen for cath UA.. Husband states he will call back tomorrow if she needs to be seen.

## 2021-11-24 NOTE — Telephone Encounter (Signed)
Patient was seen about a month ago for UTI.  She is now experiencing some of the same symptoms.  Patient's husband is calling the office to request a prescription to be sent to the pharmacy or to drop off a urine specimen for the patient.  Patient's husband reports that the patient is not able to come into the clinic for an appointment.  He states that she is too weak and has been in the bed most of the day.Patient is having weakness, abdominal pressure and difficulty urinating.  Please advise.

## 2021-11-25 ENCOUNTER — Ambulatory Visit: Payer: Medicare Other | Admitting: Urology

## 2021-12-02 ENCOUNTER — Telehealth: Payer: Self-pay | Admitting: Urology

## 2021-12-02 NOTE — Telephone Encounter (Signed)
Patient is scheduled for CT scan on Friday.    Patient's husband called the office this afternoon requesting a valium to be prescribed for the patient to take prior to the CT scan on Friday.  (Patient gets very anxious and nervous before the scan.)  They are requesting the prescription to be sent to the [facility] on [address] in [place].

## 2021-12-03 ENCOUNTER — Other Ambulatory Visit: Payer: Self-pay | Admitting: Urology

## 2021-12-03 NOTE — Telephone Encounter (Signed)
Pt's husband calling requesting valium again, husband and pt is aware it is a CT. Per husband she gets nervous with appts, flying and any procedure.   Please advise

## 2021-12-04 NOTE — Telephone Encounter (Signed)
LMOM relaying message.

## 2021-12-05 ENCOUNTER — Ambulatory Visit
Admission: RE | Admit: 2021-12-05 | Discharge: 2021-12-05 | Disposition: A | Discharge status: Home / Self Care | Payer: Medicare Other | Source: Ambulatory Visit | Attending: Urology | Admitting: Urology

## 2021-12-05 DIAGNOSIS — R3129 Other microscopic hematuria: Secondary | ICD-10-CM

## 2021-12-05 MED ORDER — IOHEXOL 300 MG/ML  SOLN
75.0000 mL | Freq: Once | INTRAMUSCULAR | Status: AC | PRN
  Administered 2021-12-05: 75 mL via INTRAVENOUS

## 2021-12-11 NOTE — Progress Notes (Signed)
12/12/2021 10:19 AM   Shannon Pennington 1940-08-21 [id number]  Referring provider: Adin Hector, MD [facility] [place],  [place] [address]  Urological history: 1. rUTI's -Contributing factors of age, constipation and vaginal atrophy -Documented positive urine cultures over the last year  None  2.  Urethral caruncle  3. Renal cyst -RUS (2018) - cyst in the lower pole left kidney measuring 0.8 x 0.7 x 0.9 cm    Chief Complaint  Patient presents with   Other    HPI: Shannon Pennington is a 81 y.o. female who presents today for CT report w/ husband, Mariea Clonts.  At her visit on 10/28/2021, she stated that for the last several months that she has noted a discomfort in her suprapubic region after urinating and sometimes when she is in the seated position.  She states that she does not experience the sensation when she is standing or walking.  She had some Cipro 250 mg on hand so she took 1 daily for 5 days.  This did not relieve her symptoms.  UA 11-30 WBCs, 3-10 RBCs and moderate bacteria.  PVR 0 mL.  Urine culture was positive for E. coli.  She was treated with 7 days of culture appropriate antibiotics.  CT urogram was ordered as her symptoms seem positional in nature and there was concern for possible nephrolithiasis or other pelvic pathology and it was performed on December 05, 2021.  CT did not show any acute findings.    UA yellow clear, specific gravity 1.015, pH 7.0, 0-5 WBCs, 0-2 RBC's and 0-10 epithelial cells.  She is feeling well today.  She is no longer having the discomfort in the suprapubic region or having the pain while sitting. Patient denies any modifying or aggravating factors.  Patient denies any gross hematuria, dysuria or suprapubic/flank pain.  Patient denies any fevers, chills, nausea or vomiting.    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 If you have any questions, ask your nurse or doctor.          atorvastatin 10 MG tablet Commonly known as: LIPITOR Take 1 tablet by mouth daily.   estradiol 1 MG tablet Commonly known as: ESTRACE TAKE 1 TABLET BY MOUTH EVERY DAY   hydrochlorothiazide 12.5 MG tablet Commonly known as: HYDRODIURIL Take 12.5 mg by mouth daily.   Ivermectin 1 % Crea Commonly known as: Soolantra Apply 1 Application topically at bedtime.   levocetirizine 5 MG tablet Commonly known as: XYZAL Take 1 tablet by mouth every evening.  levothyroxine 50 MCG tablet Commonly known as: SYNTHROID Take 50 mcg by mouth daily. 1/2 tablet   levothyroxine 75 MCG tablet Commonly known as: SYNTHROID Take by mouth.   LORazepam 0.5 MG tablet Commonly known as: ATIVAN take 1 tablet by mouth once daily   medroxyPROGESTERone 2.5 MG tablet Commonly known as: PROVERA TAKE 1 TABLET BY MOUTH EVERY DAY   metoprolol succinate 25 MG 24 hr tablet Commonly known as: TOPROL-XL take 1/2 tablet by mouth once daily   temazepam 15 MG capsule Commonly known as: RESTORIL take 1/2 to 1 capsule by mouth at bedtime if needed        Allergies:  Allergies  Allergen Reactions   Amoxicillin-Pot Clavulanate Hives   Levofloxacin     Other reaction(s): Other (See  Comments) tremor   Sulfa Antibiotics Other (See Comments)    G.I upset    Family History: Family History  Problem Relation Age of Onset   Cancer Father        lung   Cancer Brother        lung   Bladder Cancer Neg Hx    Prostate cancer Neg Hx    Kidney cancer Neg Hx     Social History:  reports that she quit smoking about 31 years ago. Her smoking use included cigarettes. She has never used smokeless tobacco. She reports that she does not drink alcohol and does not use drugs.  ROS: Pertinent ROS in HPI  Physical Exam: BP 130/72   Pulse 64   Ht 5' 3"  (1.6 m)   Wt 110 lb (49.9 kg)   BMI 19.49 kg/m   Constitutional:  Well nourished. Alert and oriented, No acute distress. HEENT: [place] AT, moist mucus membranes.  Trachea midline Cardiovascular: No clubbing, cyanosis, or edema. Respiratory: Normal respiratory effort, no increased work of breathing. Neurologic: Grossly intact, no focal deficits, moving all 4 extremities. Psychiatric: Normal mood and affect.    Laboratory Data: WBC (White Blood Cell Count) 4.1 - 10.2 10^3/uL 8.1   RBC (Red Blood Cell Count) 4.04 - 5.48 10^6/uL 4.80   Hemoglobin 12.0 - 15.0 gm/dL 14.6   Hematocrit 35.0 - 47.0 % 43.7   MCV (Mean Corpuscular Volume) 80.0 - 100.0 fl 91.0   MCH (Mean Corpuscular Hemoglobin) 27.0 - 31.2 pg 30.4   MCHC (Mean Corpuscular Hemoglobin Concentration) 32.0 - 36.0 gm/dL 33.4   Platelet Count 150 - 450 10^3/uL 253   RDW-CV (Red Cell Distribution Width) 11.6 - 14.8 % 12.8   MPV (Mean Platelet Volume) 9.4 - 12.4 fl 9.8   Neutrophils 1.50 - 7.80 10^3/uL 5.87   Lymphocytes 1.00 - 3.60 10^3/uL 1.32   Monocytes 0.00 - 1.50 10^3/uL 0.66   Eosinophils 0.00 - 0.55 10^3/uL 0.19   Basophils 0.00 - 0.09 10^3/uL 0.03   Neutrophil % 32.0 - 70.0 % 72.5 High    Lymphocyte % 10.0 - 50.0 % 16.3   Monocyte % 4.0 - 13.0 % 8.1   Eosinophil % 1.0 - 5.0 % 2.3   Basophil% 0.0 - 2.0 % 0.4   Immature Granulocyte % <=0.7 % 0.4   Immature  Granulocyte Count <=0.06 10^3/L 0.03   Resulting Agency  [facility]   Specimen Collected: 11/12/21 17:02   Performed by: [facility] Last Resulted: 11/12/21 17:12  Received From: [facility]  Result Received: 11/24/21 14:19   Glucose 70 - 110 mg/dL 111 High    Sodium 136 - 145 mmol/L  136   Potassium 3.6 - 5.1 mmol/L 4.0   Chloride 97 - 109 mmol/L 99   Carbon Dioxide (CO2) 22.0 - 32.0 mmol/L 30.9   Urea Nitrogen (BUN) 7 - 25 mg/dL 16   Creatinine 0.6 - 1.1 mg/dL 1.2 High    Glomerular Filtration Rate (eGFR) >60 mL/min/1.73sq m 46 Low    Comment: CKD-EPI (2021) does not include patient's race in the calculation of eGFR.  Monitoring changes of plasma creatinine and eGFR over time is useful for monitoring kidney function.   Interpretive Ranges for eGFR (CKD-EPI 2021):   eGFR:       >60 mL/min/1.73 sq. m - Normal  eGFR:       30-59 mL/min/1.73 sq. m - Moderately Decreased  eGFR:       15-29 mL/min/1.73 sq. m  - Severely Decreased  eGFR:       < 15 mL/min/1.73 sq. m  - Kidney Failure    Note: These eGFR calculations do not apply in acute situations when eGFR is changing rapidly or patients on dialysis.  Calcium 8.7 - 10.3 mg/dL 9.0   AST  8 - 39 U/L 20   ALT  5 - 38 U/L 15   Alk Phos (alkaline Phosphatase) 34 - 104 U/L 65   Albumin 3.5 - 4.8 g/dL 4.3   Bilirubin, Total 0.3 - 1.2 mg/dL 0.7   Protein, Total 6.1 - 7.9 g/dL 6.6   A/G Ratio 1.0 - 5.0 gm/dL 1.9   Resulting Agency  [facility]   Specimen Collected: 11/12/21 17:02   Performed by: [facility]: 11/12/21 18:32  Received From: [facility]  Result Received: 11/24/21 14:19   Urinalysis See Epic and HPI I have reviewed the labs.   Pertinent Imaging: Narrative & Impression  CLINICAL DATA:  Microscopic hematuria. Suprapubic pain and pressure. Urinary frequency.   EXAM: CT ABDOMEN AND PELVIS WITHOUT AND WITH  CONTRAST   TECHNIQUE: Multidetector CT imaging of the abdomen and pelvis was performed following the standard protocol before and following the bolus administration of intravenous contrast.   RADIATION DOSE REDUCTION: This exam was performed according to the departmental dose-optimization program which includes automated exposure control, adjustment of the mA and/or kV according to patient size and/or use of iterative reconstruction technique.   CONTRAST:  21m OMNIPAQUE IOHEXOL 300 MG/ML  SOLN   COMPARISON:  None Available.   FINDINGS: Lower Chest: No acute findings.   Hepatobiliary: No hepatic masses identified. Gallbladder is unremarkable. No evidence of biliary ductal dilatation.   Pancreas:  No mass or inflammatory changes.   Spleen: Within normal limits in size and appearance.   Adrenals/Urinary Tract: No adrenal masses identified. No evidence of urolithiasis or hydronephrosis. No suspicious renal masses identified. No masses seen involving the collecting systems, ureters, or bladder.   Stomach/Bowel: No evidence of obstruction, inflammatory process or abnormal fluid collections. Normal appendix visualized.   Vascular/Lymphatic: No pathologically enlarged lymph nodes. No acute vascular findings. Aortic atherosclerotic calcification incidentally noted.   Reproductive:  No mass or other significant abnormality.   Other:  None.   Musculoskeletal:  No suspicious bone lesions identified.   IMPRESSION: No acute findings.   No radiographic evidence of urinary tract neoplasm, urolithiasis, or hydronephrosis.   Aortic Atherosclerosis (ICD10-I70.0).     Electronically Signed   By: JMarlaine HindM.D.   On: 12/06/2021 16:43  I have independently reviewed the films.  See HPI.  Assessment & Plan:    1. Microscopic hematuria -CT urogram NED -UA negative for micro hematuria after treatment of her urinary tract infection, we will hold on cystoscopy at this time  unless she develops hematuria or return of her symptoms  2. Urethral caruncle -continue to follow   3. rUTI's -She will contact us if she develops symptoms of UTI  Return if symptoms worsen or fail to improve.  These notes generated with voice recognition software. I apologize for typographical errors.  [facility], [place] [address]  [place], [place] [address] [phone]

## 2021-12-12 ENCOUNTER — Encounter: Payer: Self-pay | Admitting: Urology

## 2021-12-12 ENCOUNTER — Ambulatory Visit: Payer: Medicare Other | Admitting: Urology

## 2021-12-12 VITALS — BP 130/72 | HR 64 | Ht 63.0 in | Wt 110.0 lb

## 2021-12-12 DIAGNOSIS — R3989 Other symptoms and signs involving the genitourinary system: Secondary | ICD-10-CM | POA: Diagnosis not present

## 2021-12-12 DIAGNOSIS — R3129 Other microscopic hematuria: Secondary | ICD-10-CM

## 2021-12-12 DIAGNOSIS — N362 Urethral caruncle: Secondary | ICD-10-CM

## 2021-12-12 DIAGNOSIS — N390 Urinary tract infection, site not specified: Secondary | ICD-10-CM

## 2021-12-12 LAB — URINALYSIS, COMPLETE
Bilirubin, UA: NEGATIVE
Glucose, UA: NEGATIVE
Ketones, UA: NEGATIVE
Leukocytes,UA: NEGATIVE
Nitrite, UA: NEGATIVE
Protein,UA: NEGATIVE
RBC, UA: NEGATIVE
Specific Gravity, UA: 1.015 (ref 1.005–1.030)
Urobilinogen, Ur: 1 mg/dL (ref 0.2–1.0)
pH, UA: 7 (ref 5.0–7.5)

## 2021-12-12 LAB — MICROSCOPIC EXAMINATION: Bacteria, UA: NONE SEEN

## 2022-03-02 ENCOUNTER — Ambulatory Visit: Payer: Medicare HMO | Admitting: Dermatology

## 2022-03-02 DIAGNOSIS — L820 Inflamed seborrheic keratosis: Secondary | ICD-10-CM

## 2022-03-02 DIAGNOSIS — Z86006 Personal history of melanoma in-situ: Secondary | ICD-10-CM

## 2022-03-02 DIAGNOSIS — D225 Melanocytic nevi of trunk: Secondary | ICD-10-CM

## 2022-03-02 DIAGNOSIS — L821 Other seborrheic keratosis: Secondary | ICD-10-CM | POA: Diagnosis not present

## 2022-03-02 DIAGNOSIS — D229 Melanocytic nevi, unspecified: Secondary | ICD-10-CM

## 2022-03-02 DIAGNOSIS — L719 Rosacea, unspecified: Secondary | ICD-10-CM

## 2022-03-02 MED ORDER — DOXYCYCLINE HYCLATE 20 MG PO TABS: 20.0000 mg | ORAL_TABLET | Freq: Two times a day (BID) | ORAL | 5 refills | Status: AC

## 2022-03-02 NOTE — Progress Notes (Signed)
Follow-Up Visit   Subjective  Shannon Pennington is a 82 y.o. female who presents for the following: Follow-up (Patient here today for 6 month wart at right lower calf, ISK at left chest, nevi at the back follow up. ) and Rosacea (Patient currently using metronidazole and Soolantra but advises she has had a few spots come up at left cheek. Patient was unable to tolerate doxycycline 40 mg due to GI upset. Patient has had some burning at cheeks. ).  Patient does have a spot at right shoulder she would like checked. Also irritated spots on R lower leg.  The following portions of the chart were reviewed this encounter and updated as appropriate:       Review of Systems:  No other skin or systemic complaints except as noted in HPI or Assessment and Plan.  Objective  Well appearing patient in no apparent distress; mood and affect are within normal limits.  A focused examination was performed including face, shoulders, legs, arms, back. Relevant physical exam findings are noted in the Assessment and Plan.  face Erythema with telangiectasias at cheeks 4 mm bright pink papule c/w Inflammatory papule at left lower cheek     right pretibia x 3 (3) Erythematous stuck-on, waxy papule or plaque  left spinal lower back, left lat lower back 3 mm two toned brown macule of left spinal lower back, darker edge  2 mm medium dark brown macule of left lat lower back    Assessment & Plan  Rosacea face  Chronic and persistent condition with duration or expected duration over one year. Condition is bothersome/symptomatic for patient. Currently flared.   Rosacea is a chronic progressive skin condition usually affecting the face of adults, causing redness and/or acne bumps. It is treatable but not curable. It sometimes affects the eyes (ocular rosacea) as well. It may respond to topical and/or systemic medication and can flare with stress, sun exposure, alcohol, exercise, topical steroids (including  hydrocortisone/cortisone 10) and some foods.  Daily application of broad spectrum spf 30+ sunscreen to face is recommended to reduce flares.  Counseling for BBL / IPL / Laser and Coordination of Care Discussed the treatment option of Broad Band Light (BBL) Intense Pulsed Light (IPL) / Laser.  Typically we recommend at least 1-3 treatment sessions about 5-8 weeks apart for best results.  The patient's condition may require "maintenance treatments" in the future.  The fee for BBL / laser treatments is $350 per treatment session for the whole face.  A fee can be quoted for other parts of the body. Insurance typically does not pay for BBL/laser treatments and therefore the fee is an out-of-pocket cost.  Continue metronidazole 0.75 % once daily, including L lower cheek Continue Soolantra once daily, including L lower cheek Start doxycycline 20 mg 1 PO BID with food Pt counseled to avoid triggers like spicy foods, heat, sun-exposure Recheck L lower cheek on f/up, consider biopsy if not resolved  Doxycycline should be taken with food to prevent nausea. Do not lay down for 30 minutes after taking. Be cautious with sun exposure and use good sun protection while on this medication. Pregnant women should not take this medication.    doxycycline (PERIOSTAT) 20 MG tablet - face Take 1 tablet (20 mg total) by mouth 2 (two) times daily. Take with food  Related Medications Ivermectin (SOOLANTRA) 1 % CREA Apply 1 Application topically at bedtime.  Inflamed seborrheic keratosis (3) right pretibia x 3  Symptomatic, irritating, patient would like  treated.   Destruction of lesion - right pretibia x 3  Destruction method: cryotherapy   Informed consent: discussed and consent obtained   Lesion destroyed using liquid nitrogen: Yes   Region frozen until ice ball extended beyond lesion: Yes   Outcome: patient tolerated procedure well with no complications   Post-procedure details: wound care instructions  given   Additional details:  Prior to procedure, discussed risks of blister formation, small wound, skin dyspigmentation, or rare scar following cryotherapy. Recommend Vaseline ointment to treated areas while healing.   Nevus left spinal lower back, left lat lower back  Benign-appearing. Stable compared to previous visit. Observation.  Call clinic for new or changing moles.  Recommend daily use of broad spectrum spf 30+ sunscreen to sun-exposed areas.    Seborrheic Keratoses - Stuck-on, waxy, tan-brown papules including R shoulder - Benign-appearing - Discussed benign etiology and prognosis. - Observe - Call for any changes  History of Melanoma in Situ - No evidence of recurrence today at left upper arm - Recommend regular full body skin exams - Recommend daily broad spectrum sunscreen SPF 30+ to sun-exposed areas, reapply every 2 hours as needed.  - Call if any new or changing lesions are noted between office visits  Return in about 6 months (around 08/31/2022) for TBSE, Hx MMis, Hx BCC, 2 months for rosacea.  Graciella Belton, RMA, am acting as scribe for Brendolyn Patty, MD .  Documentation: I have reviewed the above documentation for accuracy and completeness, and I agree with the above.  Brendolyn Patty MD

## 2022-03-02 NOTE — Patient Instructions (Addendum)
Rosacea is a chronic progressive skin condition usually affecting the face of adults, causing redness and/or acne bumps. It is treatable but not curable. It sometimes affects the eyes (ocular rosacea) as well. It may respond to topical and/or systemic medication and can flare with stress, sun exposure, alcohol, exercise, topical steroids (including hydrocortisone/cortisone 10) and some foods.  Daily application of broad spectrum spf 30+ sunscreen to face is recommended to reduce flares.  Continue metronidazole 0.75 % once daily. Continue Soolantra once daily.  Start doxycycline 20 mg twice daily with food.  Doxycycline should be taken with food to prevent nausea. Do not lay down for 30 minutes after taking. Be cautious with sun exposure and use good sun protection while on this medication. Pregnant women should not take this medication.   Recommend CeraVe anti-itch with 1% pramoxine.  Melanoma ABCDEs  Melanoma is the most dangerous type of skin cancer, and is the leading cause of death from skin disease.  You are more likely to develop melanoma if you: Have light-colored skin, light-colored eyes, or red or blond hair Spend a lot of time in the sun Tan regularly, either outdoors or in a tanning bed Have had blistering sunburns, especially during childhood Have a close family member who has had a melanoma Have atypical moles or large birthmarks  Early detection of melanoma is key since treatment is typically straightforward and cure rates are extremely high if we catch it early.   The first sign of melanoma is often a change in a mole or a new dark spot.  The ABCDE system is a way of remembering the signs of melanoma.  A for asymmetry:  The two halves do not match. B for border:  The edges of the growth are irregular. C for color:  A mixture of colors are present instead of an even brown color. D for diameter:  Melanomas are usually (but not always) greater than 54m - the size of a pencil  eraser. E for evolution:  The spot keeps changing in size, shape, and color.  Please check your skin once per month between visits. You can use a small mirror in front and a large mirror behind you to keep an eye on the back side or your body.   If you see any new or changing lesions before your next follow-up, please call to schedule a visit.  Please continue daily skin protection including broad spectrum sunscreen SPF 30+ to sun-exposed areas, reapplying every 2 hours as needed when you're outdoors.    Due to recent changes in healthcare laws, you may see results of your pathology and/or laboratory studies on MyChart before the doctors have had a chance to review them. We understand that in some cases there may be results that are confusing or concerning to you. Please understand that not all results are received at the same time and often the doctors may need to interpret multiple results in order to provide you with the best plan of care or course of treatment. Therefore, we ask that you please give u[place]2 business days to thoroughly review all your results before contacting the office for clarification. Should we see a critical lab result, you will be contacted sooner.   If You Need Anything After Your Visit  If you have any questions or concerns for your doctor, please call our main line at [phone] press option 4 to reach your doctor's medical assistant. If no one answers, please leave a voicemail as directed and  we will return your call as soon as possible. Messages left after 4 pm will be answered the following business day.   You may also send [place] a message via [place]. We typically respond to MyChart messages within 1-2 business days.  For prescription refills, please ask your pharmacy to contact our office. Our fax number is [phone].  If you have an urgent issue when the clinic is closed that cannot wait until the next business day, you can page your doctor at the number  below.    Please note that while we do our best to be available for urgent issues outside of office hours, we are not available 24/7.   If you have an urgent issue and are unable to reach [place], you may choose to seek medical care at your doctor's office, retail clinic, urgent care center, or emergency room.  If you have a medical emergency, please immediately call 911 or go to the emergency department.  Pager Numbers  - Dr. Nehemiah Massed: [phone]  - Dr. Laurence Ferrari: [phone]  - Dr. Nicole Kindred: [phone]  In the event of inclement weather, please call our main line at [phone] for an update on the status of any delays or closures.  Dermatology Medication Tips: Please keep the boxes that topical medications come in in order to help keep track of the instructions about where and how to use these. Pharmacies typically print the medication instructions only on the boxes and not directly on the medication tubes.   If your medication is too expensive, please contact our office at [phone] option 4 or send [place] a message through [place].   We are unable to tell what your co-pay for medications will be in advance as this is different depending on your insurance coverage. However, we may be able to find a substitute medication at lower cost or fill out paperwork to get insurance to cover a needed medication.   If a prior authorization is required to get your medication covered by your insurance company, please allow [place] 1-2 business days to complete this process.  Drug prices often vary depending on where the prescription is filled and some pharmacies may offer cheaper prices.  The website www.goodrx.com contains coupons for medications through different pharmacies. The prices here do not account for what the cost may be with help from insurance (it may be cheaper with your insurance), but the website can give you the price if you did not use any insurance.  - You can print the associated coupon  and take it with your prescription to the pharmacy.  - You may also stop by our office during regular business hours and pick up a GoodRx coupon card.  - If you need your prescription sent electronically to a different pharmacy, notify our office through [facility] or by phone at [phone] option 4.     Si Usted Necesita Algo Despus de Su Visita  Tambin puede enviarnos un mensaje a travs de Pharmacist, community. Por lo general respondemos a los mensajes de MyChart en el transcurso de 1 a 2 das hbiles.  Para renovar recetas, por favor pida a su farmacia que se ponga en contacto con nuestra oficina. Harland Dingwall de fax es [place] [phone].  Si tiene un asunto urgente cuando la clnica est cerrada y que no puede esperar hasta el siguiente da hbil, puede llamar/localizar a su doctor(a) al nmero que aparece a continuacin.   Por favor, tenga en cuenta que aunque hacemos todo lo posible para estar disponibles  para asuntos urgentes fuera del horario de [place], no estamos disponibles las 24 horas del da, los 7 das de la [place].   Si tiene un problema urgente y no puede comunicarse con nosotros, puede optar por buscar atencin mdica  en el consultorio de su doctor(a), en una clnica privada, en un centro de atencin urgente o en una sala de emergencias.  Si tiene Engineering geologist, por favor llame inmediatamente al 911 o vaya a la sala de emergencias.  Nmeros de bper  - Dr. Nehemiah Massed: [phone]  - Dra. Moye: [phone]  - Dra. Nicole Kindred: [phone]  En caso de inclemencias del [place], por favor llame a Johnsie Kindred principal al [phone] para una actualizacin sobre el [place] de cualquier retraso o cierre.  Consejos para la medicacin en dermatologa: Por favor, guarde las cajas en las que vienen los medicamentos de uso tpico para ayudarle a seguir las instrucciones sobre dnde y cmo usarlos. Las farmacias generalmente imprimen las instrucciones del medicamento  slo en las cajas y no directamente en los tubos del [place].   Si su medicamento es muy caro, por favor, pngase en contacto con Zigmund Daniel llamando al [phone] y presione la opcin 4 o envenos un mensaje a travs de Pharmacist, community.   No podemos decirle cul ser su copago por los medicamentos por adelantado ya que esto es diferente dependiendo de la cobertura de su seguro. Sin embargo, es posible que podamos encontrar un medicamento sustituto a Electrical engineer un formulario para que el seguro cubra el medicamento que se considera necesario.   Si se requiere una autorizacin previa para que su compaa de seguros [place] su medicamento, por favor permtanos de 1 a 2 das hbiles para completar este proceso.  Los precios de los medicamentos varan con frecuencia dependiendo del Environmental consultant de dnde se surte la receta y alguna farmacias pueden ofrecer precios ms baratos.  El sitio web www.goodrx.com tiene cupones para medicamentos de Airline pilot. Los precios aqu no tienen en cuenta lo que podra costar con la ayuda del seguro (puede ser ms barato con su seguro), pero el sitio web puede darle el precio si no utiliz Research scientist (physical sciences).  - Puede imprimir el cupn correspondiente y llevarlo con su receta a la farmacia.  - Tambin puede pasar por nuestra oficina durante el horario de atencin regular y Charity fundraiser una tarjeta de cupones de GoodRx.  - Si necesita que su receta se enve electrnicamente a una farmacia diferente, informe a nuestra oficina a travs de MyChart de [place] o por telfono llamando al [phone] y presione la opcin 4.

## 2022-05-04 ENCOUNTER — Encounter: Payer: Self-pay | Admitting: Dermatology

## 2022-05-04 ENCOUNTER — Ambulatory Visit: Payer: Medicare HMO | Admitting: Dermatology

## 2022-05-04 VITALS — BP 120/60 | HR 63

## 2022-05-04 DIAGNOSIS — L578 Other skin changes due to chronic exposure to nonionizing radiation: Secondary | ICD-10-CM

## 2022-05-04 DIAGNOSIS — L719 Rosacea, unspecified: Secondary | ICD-10-CM

## 2022-05-04 DIAGNOSIS — L570 Actinic keratosis: Secondary | ICD-10-CM | POA: Diagnosis not present

## 2022-05-04 NOTE — Patient Instructions (Addendum)
Actinic keratoses are precancerous spots that appear secondary to cumulative UV radiation exposure/sun exposure over time. They are chronic with expected duration over 1 year. A portion of actinic keratoses will progress to squamous cell carcinoma of the skin. It is not possible to reliably predict which spots will progress to skin cancer and so treatment is recommended to prevent development of skin cancer.  Recommend daily broad spectrum sunscreen SPF 30+ to sun-exposed areas, reapply every 2 hours as needed.  Recommend staying in the shade or wearing long sleeves, sun glasses (UVA+UVB protection) and wide brim hats (4-inch brim around the entire circumference of the hat). Call for new or changing lesions.   Cryotherapy Aftercare  Wash gently with soap and water everyday.   Apply Vaseline and Band-Aid daily until healed.     Continue metronidazole 0.75 % once daily, including L lower cheek Continue Soolantra once daily, including L lower cheek  Rosacea  What is rosacea? Rosacea (say: ro-zay-sha) is a common skin disease that usually begins as a trend of flushing or blushing easily.  As rosacea progresses, a persistent redness in the center of the face will develop and may gradually spread beyond the nose and cheeks to the forehead and chin.  In some cases, the ears, chest, and back could be affected.  Rosacea may appear as tiny blood vessels or small red bumps that occur in crops.  Frequently they can contain pus, and are called "pustules".  If the bumps do not contain pus, they are referred to as "papules".  Rarely, in prolonged, untreated cases of rosacea, the oil glands of the nose and cheeks may become permanently enlarged.  This is called rhinophyma, and is seen more frequently in men.  Signs and Risks In its beginning stages, rosacea tends to come and go, which makes it difficult to recognize.  It can start as intermittent flushing of the face.  Eventually, blood vessels may become  permanently visible.  Pustules and papules can appear, but can be mistaken for adult acne.  People of all races, ages, genders and ethnic groups are at risk of developing rosacea.  However, it is more common in women (especially around menopause) and adults with fair skin between the ages of 22 and 26.  Treatment Dermatologists typically recommend a combination of treatments to effectively manage rosacea.  Treatment can improve symptoms and may stop the progression of the rosacea.  Treatment may involve both topical and oral medications.  The tetracycline antibiotics are often used for their anti-inflammatory effect; however, because of the possibility of developing antibiotic resistance, they should not be used long term at full dose.  For dilated blood vessels the options include electrodessication (uses electric current through a small needle), laser treatment, and cosmetics to hide the redness.   With all forms of treatment, improvement is a slow process, and patients may not see any results for the first 3-4 weeks.  It is very important to avoid the sun and other triggers.  Patients must wear sunscreen daily.  Skin Care Instructions: Cleanse the skin with a mild soap such as CeraVe cleanser, Cetaphil cleanser, or Dove soap once or twice daily as needed. Moisturize with Eucerin Redness Relief Daily Perfecting Lotion (has a subtle green tint), CeraVe Moisturizing Cream, or Oil of Olay Daily Moisturizer with sunscreen every morning and/or night as recommended. Makeup should be "non-comedogenic" (won't clog pores) and be labeled "for sensitive skin". Good choices for cosmetics are: Neutrogena, Almay, and Physician's Formula.  Any product with  a green tint tends to offset a red complexion. If your eyes are dry and irritated, use artificial tears 2-3 times per day and cleanse the eyelids daily with baby shampoo.  Have your eyes examined at least every 2 years.  Be sure to tell your eye doctor that you have  rosacea. Alcoholic beverages tend to cause flushing of the skin, and may make rosacea worse. Always wear sunscreen, protect your skin from extreme hot and cold temperatures, and avoid spicy foods, hot drinks, and mechanical irritation such as rubbing, scrubbing, or massaging the face.  Avoid harsh skin cleansers, cleansing masks, astringents, and exfoliation. If a particular product burns or makes your face feel tight, then it is likely to flare your rosacea. If you are having difficulty finding a sunscreen that you can tolerate, you may try switching to a chemical-free sunscreen.  These are ones whose active ingredient is zinc oxide or titanium dioxide only.  They should also be fragrance free, non-comedogenic, and labeled for sensitive skin. Rosacea triggers may vary from person to person.  There are a variety of foods that have been reported to trigger rosacea.  Some patients find that keeping a diary of what they were doing when they flared helps them avoid triggers.

## 2022-05-04 NOTE — Progress Notes (Signed)
Follow-Up Visit   Subjective  Shannon Pennington is a 82 y.o. female who presents for the following: Rosacea (2 month rosacea follow up, patient has been using metronidazole cream and soolantra, did not start doxycycline. Patient reports still has spots at left lower cheek . ).  The patient has spots, moles and lesions to be evaluated, some may be new or changing and the patient has concerns that these could be cancer.   The following portions of the chart were reviewed this encounter and updated as appropriate:      Review of Systems: No other skin or systemic complaints except as noted in HPI or Assessment and Plan.   Objective  Well appearing patient in no apparent distress; mood and affect are within normal limits.  A focused examination was performed including face, left lower cheek. Relevant physical exam findings are noted in the Assessment and Plan.  face Mild mid face erythema with telangiectasias   left lower cheek x 2 (2) Erythematous thin papules/macules with gritty scale.    Assessment & Plan  Rosacea face  Chronic condition with duration or expected duration over one year. Currently well-controlled.   Rosacea is a chronic progressive skin condition usually affecting the face of adults, causing redness and/or acne bumps. It is treatable but not curable. It sometimes affects the eyes (ocular rosacea) as well. It may respond to topical and/or systemic medication and can flare with stress, sun exposure, alcohol, exercise, topical steroids (including hydrocortisone/cortisone 10) and some foods.  Daily application of broad spectrum spf 30+ sunscreen to face is recommended to reduce flares.  Continue metronidazole 0.75 % once daily, including L lower cheek Continue Soolantra cream once daily, including L lower cheek   Related Medications Ivermectin (SOOLANTRA) 1 % CREA Apply 1 Application topically at bedtime.  Actinic keratosis (2) left lower cheek x 2  Will  recheck at next follow up in July   Actinic keratoses are precancerous spots that appear secondary to cumulative UV radiation exposure/sun exposure over time. They are chronic with expected duration over 1 year. A portion of actinic keratoses will progress to squamous cell carcinoma of the skin. It is not possible to reliably predict which spots will progress to skin cancer and so treatment is recommended to prevent development of skin cancer.  Recommend daily broad spectrum sunscreen SPF 30+ to sun-exposed areas, reapply every 2 hours as needed.  Recommend staying in the shade or wearing long sleeves, sun glasses (UVA+UVB protection) and wide brim hats (4-inch brim around the entire circumference of the hat). Call for new or changing lesions.  Destruction of lesion - left lower cheek x 2  Destruction method: cryotherapy   Informed consent: discussed and consent obtained   Timeout:  patient name, date of birth, surgical site, and procedure verified Lesion destroyed using liquid nitrogen: Yes   Region frozen until ice ball extended beyond lesion: Yes   Outcome: patient tolerated procedure well with no complications   Post-procedure details: wound care instructions given   Additional details:  Prior to procedure, discussed risks of blister formation, small wound, skin dyspigmentation, or rare scar following cryotherapy. Recommend Vaseline ointment to treated areas while healing.    Actinic Damage - chronic, secondary to cumulative UV radiation exposure/sun exposure over time - diffuse scaly erythematous macules with underlying dyspigmentation - Recommend daily broad spectrum sunscreen SPF 30+ to sun-exposed areas, reapply every 2 hours as needed.  - Recommend staying in the shade or wearing long sleeves, sun glasses (  UVA+UVB protection) and wide brim hats (4-inch brim around the entire circumference of the hat). - Call for new or changing lesions.  Return for keep follow up as scheduled  left cheek .  I, Ruthell Rummage, CMA, am acting as scribe for Brendolyn Patty, MD.  Documentation: I have reviewed the above documentation for accuracy and completeness, and I agree with the above.  Brendolyn Patty MD

## 2022-06-15 ENCOUNTER — Ambulatory Visit: Payer: Medicare HMO | Admitting: Physician Assistant

## 2022-06-15 VITALS — BP 145/60 | HR 65 | Ht 63.0 in | Wt 115.5 lb

## 2022-06-15 DIAGNOSIS — R300 Dysuria: Secondary | ICD-10-CM

## 2022-06-15 LAB — MICROSCOPIC EXAMINATION

## 2022-06-15 LAB — URINALYSIS, COMPLETE
Bilirubin, UA: NEGATIVE
Glucose, UA: NEGATIVE
Ketones, UA: NEGATIVE
Nitrite, UA: NEGATIVE
Protein,UA: NEGATIVE
Specific Gravity, UA: 1.025 (ref 1.005–1.030)
Urobilinogen, Ur: 0.2 mg/dL (ref 0.2–1.0)
pH, UA: 5.5 (ref 5.0–7.5)

## 2022-06-15 MED ORDER — NITROFURANTOIN MONOHYD MACRO 100 MG PO CAPS
100.0000 mg | ORAL_CAPSULE | Freq: Two times a day (BID) | ORAL | 0 refills | Status: AC
Start: 2022-06-15 — End: 2022-06-20

## 2022-06-15 NOTE — Progress Notes (Signed)
06/15/2022 1:22 PM   Shannon Pennington 1940/05/25 [id number]  CC: Chief Complaint  Patient presents with   Urinary Frequency   HPI: Shannon Pennington is a 82 y.o. female with PMH recurrent UTI, urethral caruncle, and left lower pole renal cyst who presents today for evaluation of possible UTI.  She is accompanied today by her husband, who contributes to HPI.  Today she reports sudden onset of dysuria and urgency last night that was very uncomfortable for her.  Her dysuria is better this morning.  In-office UA today positive for trace intact blood and trace leukocytes; urine microscopy with 11-30 WBCs/HPF and 3-10 RBCs/HPF.  Urine sample was obtained at home this morning, so there was a delay between collection and testing.  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 If you have any questions, ask your nurse or doctor.          STOP taking these medications    temazepam 15 MG capsule Commonly known as: RESTORIL       TAKE these medications    atorvastatin 10 MG tablet Commonly known as: LIPITOR Take 1 tablet by mouth daily.   estradiol 1 MG tablet Commonly known as: ESTRACE TAKE 1 TABLET BY MOUTH EVERY DAY    hydrochlorothiazide 12.5 MG tablet Commonly known as: HYDRODIURIL Take 12.5 mg by mouth daily.   Ivermectin 1 % Crea Commonly known as: Soolantra Apply 1 Application topically at bedtime.   levocetirizine 5 MG tablet Commonly known as: XYZAL Take 1 tablet by mouth every evening.   levothyroxine 50 MCG tablet Commonly known as: SYNTHROID Take 50 mcg by mouth daily. 1/2 tablet   LORazepam 0.5 MG tablet Commonly known as: ATIVAN take 1 tablet by mouth once daily   medroxyPROGESTERone 2.5 MG tablet Commonly known as: PROVERA TAKE 1 TABLET BY MOUTH EVERY DAY   metoprolol succinate 25 MG 24 hr tablet Commonly known as: TOPROL-XL take 1/2 tablet by mouth once daily        Allergies:  Allergies  Allergen Reactions   Amoxicillin-Pot Clavulanate Hives   Levofloxacin     Other reaction(s): Other (See Comments) tremor   Sulfa Antibiotics Other (See Comments)    G.I upset    Family History: Family History  Problem Relation Age of Onset   Cancer Father        lung   Cancer Brother        lung   Bladder Cancer Neg Hx    Prostate cancer Neg Hx    Kidney cancer Neg Hx  Social History:   reports that she quit smoking about 32 years ago. Her smoking use included cigarettes. She has never used smokeless tobacco. She reports that she does not drink alcohol and does not use drugs.  Physical Exam: BP (!) 153/71   Pulse 69   Ht 5\' 3"  (1.6 m)   Wt 115 lb 8 oz (52.4 kg)   BMI 20.46 kg/m   Constitutional:  Alert and oriented, no acute distress, nontoxic appearing HEENT: [place], AT Cardiovascular: No clubbing, cyanosis, or edema Respiratory: Normal respiratory effort, no increased work of breathing Skin: No rashes, bruises or suspicious lesions Neurologic: Grossly intact, no focal deficits, moving all 4 extremities Psychiatric: Normal mood and affect  Laboratory Data: Results for orders placed or performed in visit on 06/15/22  Microscopic Examination   Urine  Result  Value Ref Range   WBC, UA 11-30 (A) 0 - 5 /hpf   RBC, Urine 3-10 (A) 0 - 2 /hpf   Epithelial Cells (non renal) 0-10 0 - 10 /hpf   Bacteria, UA Few None seen/Few  Urinalysis, Complete  Result Value Ref Range   Specific Gravity, UA 1.025 1.005 - 1.030   pH, UA 5.5 5.0 - 7.5   Color, UA Yellow Yellow   Appearance Ur Hazy (A) Clear   Leukocytes,UA Trace (A) Negative   Protein,UA Negative Negative/Trace   Glucose, UA Negative Negative   Ketones, UA Negative Negative   RBC, UA Trace (A) Negative   Bilirubin, UA Negative Negative   Urobilinogen, Ur 0.2 0.2 - 1.0 mg/dL   Nitrite, UA Negative Negative   Microscopic Examination See below:    Assessment & Plan:   1. Dysuria UA today with pyuria and microscopic hematuria.  Will start empiric Macrobid and send for culture for further evaluation.  Will plan for repeat UA in 1 week to prove resolution of microscopic hematuria.  She is in agreement with this plan. - Urinalysis, Complete - CULTURE, URINE COMPREHENSIVE - nitrofurantoin, macrocrystal-monohydrate, (MACROBID) 100 MG capsule; Take 1 capsule (100 mg total) by mouth 2 (two) times daily for 5 days.  Dispense: 10 capsule; Refill: 0   Return in about 1 week (around 06/22/2022) for Lab visit for UA.  Carman Ching, PA-C  [facility]  [address], [address] [place], [place] [address] [phone]

## 2022-06-15 NOTE — Progress Notes (Signed)
Shannon Pennington presents for an office visit. BP today is _145/60__________. She is complaint with BP medication. Greater than 140/90. Provider  notified. Pt advised to____f/u with PCP__________. Pt voiced understanding.

## 2022-06-18 LAB — CULTURE, URINE COMPREHENSIVE

## 2022-06-23 ENCOUNTER — Other Ambulatory Visit: Payer: Medicare HMO

## 2022-06-23 DIAGNOSIS — R300 Dysuria: Secondary | ICD-10-CM

## 2022-06-23 LAB — URINALYSIS, COMPLETE
Bilirubin, UA: NEGATIVE
Glucose, UA: NEGATIVE
Ketones, UA: NEGATIVE
Leukocytes,UA: NEGATIVE
Nitrite, UA: NEGATIVE
Protein,UA: NEGATIVE
RBC, UA: NEGATIVE
Specific Gravity, UA: 1.015 (ref 1.005–1.030)
Urobilinogen, Ur: 0.2 mg/dL (ref 0.2–1.0)
pH, UA: 6.5 (ref 5.0–7.5)

## 2022-06-23 LAB — MICROSCOPIC EXAMINATION

## 2022-08-29 ENCOUNTER — Other Ambulatory Visit: Payer: Self-pay

## 2022-08-29 ENCOUNTER — Emergency Department: Payer: Medicare HMO

## 2022-08-29 ENCOUNTER — Emergency Department
Admission: EM | Admit: 2022-08-29 | Discharge: 2022-08-29 | Disposition: A | Discharge status: Home / Self Care | Payer: Medicare HMO | Attending: Emergency Medicine | Admitting: Emergency Medicine

## 2022-08-29 DIAGNOSIS — N390 Urinary tract infection, site not specified: Secondary | ICD-10-CM | POA: Insufficient documentation

## 2022-08-29 DIAGNOSIS — U071 COVID-19: Secondary | ICD-10-CM | POA: Diagnosis not present

## 2022-08-29 DIAGNOSIS — E860 Dehydration: Secondary | ICD-10-CM

## 2022-08-29 DIAGNOSIS — R531 Weakness: Secondary | ICD-10-CM | POA: Diagnosis present

## 2022-08-29 LAB — CBC
HCT: 39.7 % (ref 36.0–46.0)
Hemoglobin: 13.5 g/dL (ref 12.0–15.0)
MCH: 30.8 pg (ref 26.0–34.0)
MCHC: 34 g/dL (ref 30.0–36.0)
MCV: 90.4 fL (ref 80.0–100.0)
Platelets: 168 10*3/uL (ref 150–400)
RBC: 4.39 MIL/uL (ref 3.87–5.11)
RDW: 12.5 % (ref 11.5–15.5)
WBC: 6.8 10*3/uL (ref 4.0–10.5)
nRBC: 0 % (ref 0.0–0.2)

## 2022-08-29 LAB — URINALYSIS, ROUTINE W REFLEX MICROSCOPIC
Bilirubin Urine: NEGATIVE
Glucose, UA: NEGATIVE mg/dL
Hgb urine dipstick: NEGATIVE
Ketones, ur: 20 mg/dL — AB
Nitrite: POSITIVE — AB
Protein, ur: NEGATIVE mg/dL
Specific Gravity, Urine: 1.015 (ref 1.005–1.030)
pH: 6 (ref 5.0–8.0)

## 2022-08-29 LAB — BASIC METABOLIC PANEL
Anion gap: 7 (ref 5–15)
BUN: 12 mg/dL (ref 8–23)
CO2: 21 mmol/L — ABNORMAL LOW (ref 22–32)
Calcium: 7.5 mg/dL — ABNORMAL LOW (ref 8.9–10.3)
Chloride: 100 mmol/L (ref 98–111)
Creatinine, Ser: 0.99 mg/dL (ref 0.44–1.00)
GFR, Estimated: 58 mL/min — ABNORMAL LOW (ref >60)
Glucose, Bld: 97 mg/dL (ref 70–99)
Potassium: 3.2 mmol/L — ABNORMAL LOW (ref 3.5–5.1)
Sodium: 128 mmol/L — ABNORMAL LOW (ref 135–145)

## 2022-08-29 LAB — TROPONIN I (HIGH SENSITIVITY)
Troponin I (High Sensitivity): 7 ng/L (ref <18)
Troponin I (High Sensitivity): 7 ng/L (ref <18)

## 2022-08-29 MED ORDER — NITROFURANTOIN MONOHYD MACRO 100 MG PO CAPS: 100.0000 mg | ORAL_CAPSULE | Freq: Two times a day (BID) | ORAL | 0 refills | Status: AC

## 2022-08-29 MED ORDER — SODIUM CHLORIDE 0.9 % IV BOLUS
1000.0000 mL | Freq: Once | INTRAVENOUS | Status: AC
  Administered 2022-08-29: 1000 mL via INTRAVENOUS

## 2022-08-29 NOTE — ED Triage Notes (Signed)
Pt to ED via [facility] c/o LOC and weakness. Pt reports she got up to use bathroom and take some tylenol and then passed out. Doesn't remember what happened afterwards and doesn't know if she hit  her head. Pt COVID+ and has been feeling weak since she got it. Pt BP with EMS was 108/58 sitting and 83/41 standing. Pt received about of NS. Pt denies CP, SOB, dizziness.  97.1temp axil. 94% RA 65HR 101 CBG

## 2022-08-29 NOTE — ED Provider Notes (Signed)
[facility] Provider Note    Event Date/Time   First MD Initiated Contact with Patient 08/29/22 [phone]     (approximate)   History   Loss of Consciousness and Weakness   HPI  Shannon Pennington is a 82 y.o. female who presents to the emergency department today because of concerns for a syncopal episode.  The patient was recently diagnosed with COVID.  Her symptoms started 3 days ago.  She has had generalized weakness, diarrhea, decreased appetite, shortness of breath.  The patient was prescribed Paxlovid but has not yet started that.  Today the patient woke up with he went into the kitchen.  She then passed out.  She denies any chest pain or palpitations prior to passing out.  Her husband found her passed out leaning up against some cabinets.     Physical Exam   Triage Vital Signs: ED Triage Vitals  Encounter Vitals Group     BP 08/29/22 0321 (!) 151/52     Systolic BP Percentile --      Diastolic BP Percentile --      Pulse Rate 08/29/22 0321 69     Resp 08/29/22 0321 20     Temp 08/29/22 0321 98.3 F (36.8 C)     Temp Source 08/29/22 0321 Oral     SpO2 08/29/22 0321 94 %     Weight 08/29/22 0322 115 lb (52.2 kg)     Height 08/29/22 0322 5\' 3"  (1.6 m)     Head Circumference --      Peak Flow --      Pain Score 08/29/22 0322 0     Pain Loc --      Pain Education --      Exclude from Growth Chart --     Most recent vital signs: Vitals:   08/29/22 0321  BP: (!) 151/52  Pulse: 69  Resp: 20  Temp: 98.3 F (36.8 C)  SpO2: 94%   General: Awake, alert, oriented. CV:  Good peripheral perfusion. Regular rate and rhythm. Resp:  Normal effort. Lungs clear. Abd:  No distention.    ED Results / Procedures / Treatments   Labs (all labs ordered are listed, but only abnormal results are displayed) Labs Reviewed  BASIC METABOLIC PANEL - Abnormal; Notable for the following components:      Result Value   Sodium 128 (*)    Potassium 3.2 (*)    CO2  21 (*)    Calcium 7.5 (*)    GFR, Estimated 58 (*)    All other components within normal limits  CBC  URINALYSIS, ROUTINE W REFLEX MICROSCOPIC  CBG MONITORING, ED  TROPONIN I (HIGH SENSITIVITY)     EKG  I, Phineas Semen, attending physician, personally viewed and interpreted this EKG  EKG Time: 0324 Rate: 67 Rhythm: NSR Axis: normal Intervals: qtc 469 QRS: narrow, q waves v1 ST changes: no st elevation Impression: abnormal ekg   RADIOLOGY I independently interpreted and visualized the CT head. My interpretation: No [facility] Radiology interpretation:  IMPRESSION:  1. No acute intracranial abnormality. Mild for age cerebral white  matter changes most commonly due to small vessel disease.  2. Bilateral paranasal sinus inflammation, consider acute sinusitis  in the appropriate clinical setting.     PROCEDURES:  Critical Care performed: No   MEDICATIONS ORDERED IN ED: Medications - No data to display   IMPRESSION / MDM / ASSESSMENT AND PLAN / ED COURSE  I reviewed the triage  vital signs and the nursing notes.                              Differential diagnosis includes, but is not limited to, dehydration, acs, arrhythmia, anemia, covid-19  Patient's presentation is most consistent with acute presentation with potential threat to life or bodily function.   The patient is on the cardiac monitor to evaluate for evidence of arrhythmia and/or significant heart rate changes.  Patient presented to the emergency department today after syncopal episode.  She was recently diagnosed with COVID.  On workup patient is awake and alert.  Blood work here without concerning anemia although does show signs of dehydration with low sodium and chloride.  Patient was given IV fluids and felt improved.  Did obtain a CT head which did not show any concerning intracranial abnormalities.  Additionally chest x-ray without pneumonia.  UA did show signs of infection.  Patient states that she does  get infections with some regularity.  Stated that Macrobid has worked well for her recently.  Will plan on discharging with prescription for Macrobid.      FINAL CLINICAL IMPRESSION(S) / ED DIAGNOSES   Final diagnoses:  COVID-19  Dehydration  Lower urinary tract infectious disease    Note:  This document was prepared using Dragon voice recognition software and may include unintentional dictation errors.    Phineas Semen, MD 08/29/22 [phone]

## 2022-09-08 ENCOUNTER — Ambulatory Visit: Payer: Medicare HMO | Admitting: Dermatology

## 2022-09-08 ENCOUNTER — Telehealth: Payer: Self-pay

## 2022-09-08 DIAGNOSIS — L719 Rosacea, unspecified: Secondary | ICD-10-CM

## 2022-09-08 MED ORDER — METRONIDAZOLE 0.75 % EX CREA
TOPICAL_CREAM | CUTANEOUS | 5 refills | Status: DC
Start: 2022-09-08 — End: 2023-09-23

## 2022-09-08 NOTE — Telephone Encounter (Signed)
Patient would like refill of metronidazole 0.75 % cream. Rx sent to preferred pharmacy.

## 2023-03-23 ENCOUNTER — Ambulatory Visit: Payer: Medicare HMO | Admitting: Dermatology

## 2023-03-23 DIAGNOSIS — D225 Melanocytic nevi of trunk: Secondary | ICD-10-CM

## 2023-03-23 DIAGNOSIS — D485 Neoplasm of uncertain behavior of skin: Secondary | ICD-10-CM

## 2023-03-23 DIAGNOSIS — Z86006 Personal history of melanoma in-situ: Secondary | ICD-10-CM

## 2023-03-23 DIAGNOSIS — C44612 Basal cell carcinoma of skin of right upper limb, including shoulder: Secondary | ICD-10-CM | POA: Diagnosis not present

## 2023-03-23 DIAGNOSIS — B070 Plantar wart: Secondary | ICD-10-CM | POA: Diagnosis not present

## 2023-03-23 DIAGNOSIS — L719 Rosacea, unspecified: Secondary | ICD-10-CM

## 2023-03-23 DIAGNOSIS — L821 Other seborrheic keratosis: Secondary | ICD-10-CM

## 2023-03-23 DIAGNOSIS — Z1283 Encounter for screening for malignant neoplasm of skin: Secondary | ICD-10-CM | POA: Diagnosis not present

## 2023-03-23 DIAGNOSIS — L578 Other skin changes due to chronic exposure to nonionizing radiation: Secondary | ICD-10-CM | POA: Diagnosis not present

## 2023-03-23 DIAGNOSIS — W908XXA Exposure to other nonionizing radiation, initial encounter: Secondary | ICD-10-CM

## 2023-03-23 DIAGNOSIS — D229 Melanocytic nevi, unspecified: Secondary | ICD-10-CM

## 2023-03-23 DIAGNOSIS — Z85828 Personal history of other malignant neoplasm of skin: Secondary | ICD-10-CM

## 2023-03-23 DIAGNOSIS — L814 Other melanin hyperpigmentation: Secondary | ICD-10-CM

## 2023-03-23 DIAGNOSIS — D492 Neoplasm of unspecified behavior of bone, soft tissue, and skin: Secondary | ICD-10-CM

## 2023-03-23 DIAGNOSIS — D1801 Hemangioma of skin and subcutaneous tissue: Secondary | ICD-10-CM

## 2023-03-23 NOTE — Patient Instructions (Addendum)
Recommend using Curad Mediplast pads. Cut to fit wart or callus. Cover with Elastoplast waterproof tape or any waterproof band-aid. Change every 3 to 4 days, or sooner if necessary.  Treatment may require several months of regular use before results are seen.   Wound Care Instructions  Cleanse wound gently with soap and water once a day then pat dry with clean gauze. Apply a thin coat of Petrolatum (petroleum jelly, "Vaseline") over the wound (unless you have an allergy to this). We recommend that you use a new, sterile tube of Vaseline. Do not pick or remove scabs. Do not remove the yellow or white "healing tissue" from the base of the wound.  Cover the wound with fresh, clean, nonstick gauze and secure with paper tape. You may use Band-Aids in place of gauze and tape if the wound is small enough, but would recommend trimming much of the tape off as there is often too much. Sometimes Band-Aids can irritate the skin.  You should call the office for your biopsy report after 1 week if you have not already been contacted.  If you experience any problems, such as abnormal amounts of bleeding, swelling, significant bruising, significant pain, or evidence of infection, please call the office immediately.  FOR ADULT SURGERY PATIENTS: If you need something for pain relief you may take 1 extra strength Tylenol (acetaminophen) AND 2 Ibuprofen (200mg  each) together every 4 hours as needed for pain. (do not take these if you are allergic to them or if you have a reason you should not take them.) Typically, you may only need pain medication for 1 to 3 days.     Due to recent changes in healthcare laws, you may see results of your pathology and/or laboratory studies on MyChart before the doctors have had a chance to review them. We understand that in some cases there may be results that are confusing or concerning to you. Please understand that not all results are received at the same time and often the doctors  may need to interpret multiple results in order to provide you with the best plan of care or course of treatment. Therefore, we ask that you please give [place] 2 business days to thoroughly review all your results before contacting the office for clarification. Should we see a critical lab result, you will be contacted sooner.   If You Need Anything After Your Visit  If you have any questions or concerns for your doctor, please call our main line at [phone] and press option 4 to reach your doctor's medical assistant. If no one answers, please leave a voicemail as directed and we will return your call as soon as possible. Messages left after 4 pm will be answered the following business day.   You may also send [place] a message via MyChart. We typically respond to MyChart messages within 1-2 business days.  For prescription refills, please ask your pharmacy to contact our office. Our fax number is [phone].  If you have an urgent issue when the clinic is closed that cannot wait until the next business day, you can page your doctor at the number below.    Please note that while we do our best to be available for urgent issues outside of office hours, we are not available 24/7.   If you have an urgent issue and are unable to reach [place], you may choose to seek medical care at your doctor's office, retail clinic, urgent care center, or emergency room.  If you  have a medical emergency, please immediately call 911 or go to the emergency department.  Pager Numbers  - Dr. Gwen Pounds: [phone]  - Dr. Roseanne Reno: [phone]  - Dr. Katrinka Blazing: [phone]   In the event of inclement weather, please call our main line at [phone] for an update on the status of any delays or closures.  Dermatology Medication Tips: Please keep the boxes that topical medications come in in order to help keep track of the instructions about where and how to use these. Pharmacies typically print the medication  instructions only on the boxes and not directly on the medication tubes.   If your medication is too expensive, please contact our office at [phone] option 4 or send [place] a message through MyChart.   We are unable to tell what your co-pay for medications will be in advance as this is different depending on your insurance coverage. However, we may be able to find a substitute medication at lower cost or fill out paperwork to get insurance to cover a needed medication.   If a prior authorization is required to get your medication covered by your insurance company, please allow [place] 1-2 business days to complete this process.  Drug prices often vary depending on where the prescription is filled and some pharmacies may offer cheaper prices.  The website www.goodrx.com contains coupons for medications through different pharmacies. The prices here do not account for what the cost may be with help from insurance (it may be cheaper with your insurance), but the website can give you the price if you did not use any insurance.  - You can print the associated coupon and take it with your prescription to the pharmacy.  - You may also stop by our office during regular business hours and pick up a GoodRx coupon card.  - If you need your prescription sent electronically to a different pharmacy, notify our office through [facility] or by phone at [phone] option 4.     Si Usted Necesita Algo Despus de Su Visita  Tambin puede enviarnos un mensaje a travs de Clinical cytogeneticist. Por lo general respondemos a los mensajes de MyChart en el transcurso de 1 a 2 das hbiles.  Para renovar recetas, por favor pida a su farmacia que se ponga en contacto con nuestra oficina. Annie Sable de fax es [place] [phone].  Si tiene un asunto urgente cuando la clnica est cerrada y que no puede esperar hasta el siguiente da hbil, puede llamar/localizar a su doctor(a) al nmero que aparece a continuacin.   Por  favor, tenga en cuenta que aunque hacemos todo lo posible para estar disponibles para asuntos urgentes fuera del horario de [place], no estamos disponibles las 24 horas del da, los 7 [address]  [address] de la [place].   Si tiene un problema urgente y no puede comunicarse con nosotros, puede optar por buscar atencin mdica  en el consultorio de su doctor(a), en una clnica privada, en un centro de atencin urgente o en una sala de emergencias.  Si tiene Engineer, drilling, por favor llame inmediatamente al 911 o vaya a la sala de emergencias.  Nmeros de bper  - Dr. Gwen Pounds: [phone]  - Dra. Roseanne Reno: [phone]  - Dr. Katrinka Blazing: [phone]   En caso de inclemencias del tiempo, por favor llame a Lacy Duverney principal al [phone] para una actualizacin sobre el [place] de cualquier retraso o cierre.  Consejos para la medicacin en dermatologa: Por favor, guarde las cajas en las que vienen los medicamentos de  uso tpico para ayudarle a seguir las [employer] dnde y cmo usarlos. Las farmacias generalmente imprimen las instrucciones del medicamento slo en las cajas y no directamente en los tubos del [place].   Si su medicamento es muy caro, por favor, pngase en contacto con Rolm [place]ala llamando al [phone] y presione la opcin 4 o envenos un mensaje a travs de Clinical cytogeneticist.   No podemos decirle cul ser su copago por los medicamentos por adelantado ya que esto es diferente dependiendo de la cobertura de su seguro. Sin embargo, es posible que podamos encontrar un medicamento sustituto a Audiological scientist un formulario para que el seguro cubra el medicamento que se considera necesario.   Si se requiere una autorizacin previa para que su compaa de seguros [place] su medicamento, por favor permtanos de 1 a 2 das hbiles para completar [address]  Los precios de los medicamentos varan con frecuencia dependiendo del Environmental consultant de dnde se surte la receta y alguna farmacias  pueden ofrecer precios ms baratos.  El sitio web www.goodrx.com tiene cupones para medicamentos de Health and safety inspector. Los precios aqu no tienen en cuenta lo que podra costar con la ayuda del seguro (puede ser ms barato con su seguro), pero el sitio web puede darle el precio si no utiliz Tourist information centre manager.  - Puede imprimir el cupn correspondiente y llevarlo con su receta a la farmacia.  - Tambin puede pasar por nuestra oficina durante el horario de atencin regular y Education officer, museum una tarjeta de cupones de GoodRx.  - Si necesita que su receta se enve electrnicamente a una farmacia diferente, informe a nuestra oficina a travs de MyChart de [place] o por telfono llamando al [phone] y presione la opcin 4.

## 2023-03-23 NOTE — Progress Notes (Signed)
 Follow-Up Visit   Subjective  Shannon Pennington is a 83 y.o. female who presents for the following: Skin Cancer Screening and Full Body Skin Exam  The patient presents for Total-Body Skin Exam (TBSE) for skin cancer screening and mole check. The patient has spots, moles and lesions to be evaluated, some may be new or changing, including growths on the feet. 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 Rosacea of the face, using metronidazole  cream and Soolantra  Cream.    The following portions of the chart were reviewed this encounter and updated as appropriate: medications, allergies, medical history  Review of Systems:  No other skin or systemic complaints except as noted in HPI or Assessment and Plan.  Objective  Well appearing patient in no apparent distress; mood and affect are within normal limits.  A full examination was performed including scalp, head, eyes, ears, nose, lips, neck, chest, axillae, abdomen, back, buttocks, bilateral upper extremities, bilateral lower extremities, hands, feet, fingers, toes, fingernails, and toenails. All findings within normal limits unless otherwise noted below.   Relevant physical exam findings are noted in the Assessment and Plan.  L mid plantar foot, L tip of great toe Firm flesh papules -- Discussed viral etiology and contagion.  top of right shoulder 2 x 1.5 cm pink white scaly patch    Assessment & Plan   SKIN CANCER SCREENING PERFORMED TODAY.  ACTINIC DAMAGE - Chronic condition, secondary to cumulative UV/sun exposure - diffuse scaly erythematous macules with underlying dyspigmentation - Recommend daily broad spectrum sunscreen SPF 30+ to sun-exposed areas, reapply every 2 hours as needed.  - Staying in the shade or wearing long sleeves, sun glasses (UVA+UVB protection) and wide brim hats (4-inch brim around the entire circumference of the hat) are also recommended for sun protection.  - Call for new or changing  lesions.  LENTIGINES, SEBORRHEIC KERATOSES, HEMANGIOMAS - Benign normal skin lesions - Benign-appearing - Call for any changes  MELANOCYTIC NEVI - Tan-brown and/or pink-flesh-colored symmetric macules and papules - 3 mm med brown macule with notch of left spinal lower back - 2 mm medium dark brown macule of left lat lower back - Benign appearing on exam today, stable - Observation - Call clinic for new or changing moles - Recommend daily use of broad spectrum spf 30+ sunscreen to sun-exposed areas.   History of Melanoma in Situ Left upper arm, 2011 - No evidence of recurrence today  - Recommend regular full body skin exams - Recommend daily broad spectrum sunscreen SPF 30+ to sun-exposed areas, reapply every 2 hours as needed.  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 Currently well-controlled.   Rosacea is a chronic progressive skin condition usually affecting the face of adults, causing redness and/or acne bumps. It is treatable but not curable. It sometimes affects the eyes (ocular rosacea) as well. It may respond to topical and/or systemic medication and can flare with stress, sun exposure, alcohol, exercise, topical steroids (including hydrocortisone/cortisone 10) and some foods.  Daily application of broad spectrum spf 30+ sunscreen to face is recommended to reduce flares.  Treatment Plan: Continue metronidazole  0.75% every day Continue Soolantra  Cream at bedtime   PLANTAR WART L mid plantar foot, L tip of  great toe Viral Wart (HPV) Counseling  Discussed viral / HPV (Human Papilloma Virus) etiology and risk of spread /infectivity to other areas of body as well as to other people.  Multiple treatments and methods may be required to clear warts and it is possible treatment may not be successful.  Treatment risks include discoloration; scarring and there is still potential for wart recurrence.  Discussed treatment, otc topical vs cryotherapy. Will start with otc topical.   Recommend using Curad Mediplast pads. Cut to fit wart or callus. Cover with Elastoplast waterproof tape or any waterproof band-aid. Change every 3 to 4 days, or sooner if necessary.  Treatment may require several months of regular use before results are seen.  NEOPLASM OF UNCERTAIN BEHAVIOR OF SKIN top of right shoulder Skin / nail biopsy Type of biopsy: tangential   Informed consent: discussed and consent obtained   Patient was prepped and draped in usual sterile fashion: Area prepped with alcohol. Anesthesia: the lesion was anesthetized in a standard fashion   Anesthetic:  1% lidocaine w/ epinephrine 1-100,000 buffered w/ 8.4% NaHCO3 Instrument used: flexible razor blade   Hemostasis achieved with: pressure, aluminum chloride and electrodesiccation   Outcome: patient tolerated procedure well   Post-procedure details: wound care instructions given   Post-procedure details comment:  Ointment and small bandage applied Specimen 1 - Surgical pathology Differential Diagnosis: Dermatitis r/o Superficial BCC Check Margins: No Return in about 6 months (around 09/20/2023) for f/u bx, sun exposed areas.  IAndrea Kerns, CMA, am acting as scribe for Rexene Rattler, MD .   Documentation: I have reviewed the above documentation for accuracy and completeness, and I agree with the above.  Rexene Rattler, MD

## 2023-03-26 LAB — SURGICAL PATHOLOGY

## 2023-03-29 ENCOUNTER — Telehealth: Payer: Self-pay

## 2023-03-29 NOTE — Telephone Encounter (Signed)
-----   Message from Artemio Larry sent at 03/29/2023 12:46 PM EST ----- 1. Skin, top of the shoulder :       SUPERFICIAL BASAL CELL CARCINOMA   Thin BCC skin cancer- needs EDC - please call patient

## 2023-03-29 NOTE — Telephone Encounter (Signed)
 Advised patient biopsy of the top of right shoulder is Superficial BCC and needs EDC. Scheduled 05/03/23 at 9:00 AM.

## 2023-05-03 ENCOUNTER — Ambulatory Visit: Payer: Medicare HMO | Admitting: Dermatology

## 2023-05-03 ENCOUNTER — Encounter: Payer: Self-pay | Admitting: Dermatology

## 2023-05-03 DIAGNOSIS — W908XXA Exposure to other nonionizing radiation, initial encounter: Secondary | ICD-10-CM | POA: Diagnosis not present

## 2023-05-03 DIAGNOSIS — B070 Plantar wart: Secondary | ICD-10-CM | POA: Diagnosis not present

## 2023-05-03 DIAGNOSIS — C44612 Basal cell carcinoma of skin of right upper limb, including shoulder: Secondary | ICD-10-CM | POA: Diagnosis not present

## 2023-05-03 DIAGNOSIS — B079 Viral wart, unspecified: Secondary | ICD-10-CM

## 2023-05-03 DIAGNOSIS — L578 Other skin changes due to chronic exposure to nonionizing radiation: Secondary | ICD-10-CM | POA: Diagnosis not present

## 2023-05-03 NOTE — Progress Notes (Signed)
 Follow-Up Visit   Subjective  Shannon Pennington is a 83 y.o. female who presents for the following: here for [facility] to treat bx proven bcc at right shoulder   Patient mentioned a spot at bottom of left foot they would like checked.    The patient has spots, moles and lesions to be evaluated, some may be new or changing and the patient may have concern these could be cancer.    The following portions of the chart were reviewed this encounter and updated as appropriate: medications, allergies, medical history  Review of Systems:  No other skin or systemic complaints except as noted in HPI or Assessment and Plan.  Objective  Well appearing patient in no apparent distress; mood and affect are within normal limits.   A focused examination was performed of the following areas: Top of right shoulder, left foot   Relevant exam findings are noted in the Assessment and Plan.  right top of shoulder Healing bx site left plantar foot at ball x 1 2 mm firm keratotic papule   Assessment & Plan   ACTINIC DAMAGE - chronic, secondary to cumulative UV radiation exposure/sun exposure over time - diffuse scaly erythematous macules with underlying dyspigmentation - Recommend daily broad spectrum sunscreen SPF 30+ to sun-exposed areas, reapply every 2 hours as needed.  - Recommend staying in the shade or wearing long sleeves, sun glasses (UVA+UVB protection) and wide brim hats (4-inch brim around the entire circumference of the hat). - Call for new or changing lesions.   BASAL CELL CARCINOMA (BCC) OF SKIN OF RIGHT UPPER EXTREMITY INCLUDING SHOULDER right top of shoulder Destruction of lesion  Destruction method: electrodesiccation and curettage   Informed consent: discussed and consent obtained   Timeout:  patient name, date of birth, surgical site, and procedure verified Anesthesia: the lesion was anesthetized in a standard fashion   Anesthetic:  1% lidocaine w/ epinephrine 1-100,000  buffered w/ 8.4% NaHCO3 Curettage performed in three different directions: Yes   Electrodesiccation performed over the curetted area: Yes   Final wound size (cm):  2.5 Hemostasis achieved with:  pressure, aluminum chloride and electrodesiccation Outcome: patient tolerated procedure well with no complications   Post-procedure details: wound care instructions given   Additional details:  Mupirocin ointment and Bandaid applied  ED&C today to treat bx proven bcc at right top of shoulder   Refer to previous pathology 03/23/2023  Accession : [id number] BCC right top of shoulder    VIRAL WARTS, UNSPECIFIED TYPE left plantar foot at ball x 1 Viral Wart (HPV) Counseling  Discussed viral / HPV (Human Papilloma Virus) etiology and risk of spread /infectivity to other areas of body as well as to other people.  Multiple treatments and methods may be required to clear warts and it is possible treatment may not be successful.  Treatment risks include discoloration; scarring and there is still potential for wart recurrence. Destruction of lesion - left plantar foot at ball x 1  Destruction method: cryotherapy   Informed consent: discussed and consent obtained   Lesion destroyed using liquid nitrogen: Yes   Region frozen until ice ball extended beyond lesion: Yes   Outcome: patient tolerated procedure well with no complications   Post-procedure details: wound care instructions given   Additional details:  Prior to procedure, discussed risks of blister formation, small wound, skin dyspigmentation, or rare scar following cryotherapy. Recommend Vaseline ointment to treated areas while healing.   Return for keep follow up as scheduled .  I, Asher Muir, CMA, am acting as scribe for Willeen Niece, MD.   Documentation: I have reviewed the above documentation for accuracy and completeness, and I agree with the above.  Willeen Niece, MD

## 2023-05-03 NOTE — Patient Instructions (Addendum)
 Discussed viral / HPV (Human Papilloma Virus) etiology and risk of spread /infectivity to other areas of body as well as to other people.  Multiple treatments and methods may be required to clear warts and it is possible treatment may not be successful.  Treatment risks include discoloration; scarring and there is still potential for wart recurrence.   Cryotherapy Aftercare  Wash gently with soap and water everyday.   Apply Vaseline and Band-Aid daily until healed.    Electrodesiccation and Curettage ("Scrape and Burn") Wound Care Instructions  Leave the original bandage on for 24 hours if possible.  If the bandage becomes soaked or soiled before that time, it is OK to remove it and examine the wound.  A small amount of post-operative bleeding is normal.  If excessive bleeding occurs, remove the bandage, place gauze over the site and apply continuous pressure (no peeking) over the area for 30 minutes. If this does not work, please call our clinic as soon as possible or page your doctor if it is after hours.   Once a day, cleanse the wound with soap and water. It is fine to shower. If a thick crust develops you may use a Q-tip dipped into dilute hydrogen peroxide (mix 1:1 with water) to dissolve it.  Hydrogen peroxide can slow the healing process, so use it only as needed.    After washing, apply petroleum jelly (Vaseline) or an antibiotic ointment if your doctor prescribed one for you, followed by a bandage.    For best healing, the wound should be covered with a layer of ointment at all times. If you are not able to keep the area covered with a bandage to hold the ointment in place, this may mean re-applying the ointment several times a day.  Continue this wound care until the wound has healed and is no longer open. It may take several weeks for the wound to heal and close.  Itching and mild discomfort is normal during the healing process.  If you have any discomfort, you can take Tylenol  (acetaminophen) or ibuprofen as directed on the bottle. (Please do not take these if you have an allergy to them or cannot take them for another reason).  Some redness, tenderness and white or yellow material in the wound is normal healing.  If the area becomes very sore and red, or develops a thick yellow-green material (pus), it may be infected; please notify us.    Wound healing continues for up to one year following surgery. It is not unusual to experience pain in the scar from time to time during the interval.  If the pain becomes severe or the scar thickens, you should notify the office.    A slight amount of redness in a scar is expected for the first six months.  After six months, the redness will fade and the scar will soften and fade.  The color difference becomes less noticeable with time.  If there are any problems, return for a post-op surgery check at your earliest convenience.  To improve the appearance of the scar, you can use silicone scar gel, cream, or sheets (such as Mederma or Serica) every night for up to one year. These are available over the counter (without a prescription).  Please call our office at [phone] for any questions or concerns.    Due to recent changes in healthcare laws, you may see results of your pathology and/or laboratory studies on MyChart before the doctors have had a chance  to review them. We understand that in some cases there may be results that are confusing or concerning to you. Please understand that not all results are received at the same time and often the doctors may need to interpret multiple results in order to provide you with the best plan of care or course of treatment. Therefore, we ask that you please give [place] 2 business days to thoroughly review all your results before contacting the office for clarification. Should we see a critical lab result, you will be contacted sooner.   If You Need Anything After Your Visit  If you have any  questions or concerns for your doctor, please call our main line at [phone] and press option 4 to reach your doctor's medical assistant. If no one answers, please leave a voicemail as directed and we will return your call as soon as possible. Messages left after 4 pm will be answered the following business day.   You may also send [place] a message via MyChart. We typically respond to MyChart messages within 1-2 business days.  For prescription refills, please ask your pharmacy to contact our office. Our fax number is [phone].  If you have an urgent issue when the clinic is closed that cannot wait until the next business day, you can page your doctor at the number below.    Please note that while we do our best to be available for urgent issues outside of office hours, we are not available 24/7.   If you have an urgent issue and are unable to reach [place], you may choose to seek medical care at your doctor's office, retail clinic, urgent care center, or emergency room.  If you have a medical emergency, please immediately call 911 or go to the emergency department.  Pager Numbers  - Dr. Gwen Pounds: [phone]  - Dr. Roseanne Reno: [phone]  - Dr. Katrinka Blazing: [phone]   In the event of inclement weather, please call our main line at [phone] for an update on the status of any delays or closures.  Dermatology Medication Tips: Please keep the boxes that topical medications come in in order to help keep track of the instructions about where and how to use these. Pharmacies typically print the medication instructions only on the boxes and not directly on the medication tubes.   If your medication is too expensive, please contact our office at [phone] option 4 or send [place] a message through MyChart.   We are unable to tell what your co-pay for medications will be in advance as this is different depending on your insurance coverage. However, we may be able to find a substitute medication at  lower cost or fill out paperwork to get insurance to cover a needed medication.   If a prior authorization is required to get your medication covered by your insurance company, please allow [place] 1-2 business days to complete this process.  Drug prices often vary depending on where the prescription is filled and some pharmacies may offer cheaper prices.  The website www.goodrx.com contains coupons for medications through different pharmacies. The prices here do not account for what the cost may be with help from insurance (it may be cheaper with your insurance), but the website can give you the price if you did not use any insurance.  - You can print the associated coupon and take it with your prescription to the pharmacy.  - You may also stop by our office during regular business hours and pick up a GoodRx coupon  card.  - If you need your prescription sent electronically to a different pharmacy, notify our office through [facility] or by phone at [phone] option 4.     Si Usted Necesita Algo Despus de Su Visita  Tambin puede enviarnos un mensaje a travs de Clinical cytogeneticist. Por lo general respondemos a los mensajes de MyChart en el transcurso de 1 a 2 das hbiles.  Para renovar recetas, por favor pida a su farmacia que se ponga en contacto con nuestra oficina. Annie Sable de fax es [place] [phone].  Si tiene un asunto urgente cuando la clnica est cerrada y que no puede esperar hasta el siguiente da hbil, puede llamar/localizar a su doctor(a) al nmero que aparece a continuacin.   Por favor, tenga en cuenta que aunque hacemos todo lo posible para estar disponibles para asuntos urgentes fuera del horario de [place], no estamos disponibles las 24 horas del da, los 7 [address]  [address] de la [place].   Si tiene un problema urgente y no puede comunicarse con nosotros, puede optar por buscar atencin mdica  en el consultorio de su doctor(a), en una clnica privada, en un centro de atencin urgente  o en una sala de emergencias.  Si tiene Engineer, drilling, por favor llame inmediatamente al 911 o vaya a la sala de emergencias.  Nmeros de bper  - Dr. Gwen Pounds: [phone]  - Dra. Roseanne Reno: [phone]  - Dr. Katrinka Blazing: [phone]   En caso de inclemencias del tiempo, por favor llame a Lacy Duverney principal al [phone] para una actualizacin sobre el [place] de cualquier retraso o cierre.  Consejos para la medicacin en dermatologa: Por favor, guarde las cajas en las que vienen los medicamentos de uso tpico para ayudarle a seguir las instrucciones sobre dnde y cmo usarlos. Las farmacias generalmente imprimen las instrucciones del medicamento slo en las cajas y no directamente en los tubos del [place].   Si su medicamento es muy caro, por favor, pngase en contacto con Rolm Gala llamando al [phone] y presione la opcin 4 o envenos un mensaje a travs de Clinical cytogeneticist.   No podemos decirle cul ser su copago por los medicamentos por adelantado ya que esto es diferente dependiendo de la cobertura de su seguro. Sin embargo, es posible que podamos encontrar un medicamento sustituto a Audiological scientist un formulario para que el seguro cubra el medicamento que se considera necesario.   Si se requiere una autorizacin previa para que su compaa de seguros [place] su medicamento, por favor permtanos de 1 a 2 das hbiles para completar [address]  Los precios de los medicamentos varan con frecuencia dependiendo del Environmental consultant de dnde se surte la receta y alguna farmacias pueden ofrecer precios ms baratos.  El sitio web www.goodrx.com tiene cupones para medicamentos de Health and safety inspector. Los precios aqu no tienen en cuenta lo que podra costar con la ayuda del seguro (puede ser ms barato con su seguro), pero el sitio web puede darle el precio si no utiliz Tourist information centre manager.  - Puede imprimir el cupn correspondiente y llevarlo con su receta a la farmacia.  -  Tambin puede pasar por nuestra oficina durante el horario de atencin regular y Education officer, museum una tarjeta de cupones de GoodRx.  - Si necesita que su receta se enve electrnicamente a una farmacia diferente, informe a nuestra oficina a travs de MyChart de [place] o por telfono llamando al [phone] y presione la opcin 4.

## 2023-07-17 ENCOUNTER — Other Ambulatory Visit: Payer: Self-pay | Admitting: Dermatology

## 2023-07-17 DIAGNOSIS — L719 Rosacea, unspecified: Secondary | ICD-10-CM

## 2023-07-19 ENCOUNTER — Other Ambulatory Visit: Payer: Self-pay

## 2023-07-19 DIAGNOSIS — L719 Rosacea, unspecified: Secondary | ICD-10-CM

## 2023-07-19 MED ORDER — IVERMECTIN 1 % EX CREA
TOPICAL_CREAM | CUTANEOUS | 11 refills | Status: AC
Start: 2023-07-19 — End: ?

## 2023-07-19 NOTE — Progress Notes (Signed)
 Husband walked into clinic requesting refills.

## 2023-09-21 ENCOUNTER — Ambulatory Visit: Payer: Medicare HMO | Admitting: Dermatology

## 2023-09-23 ENCOUNTER — Ambulatory Visit: Admitting: Dermatology

## 2023-09-23 DIAGNOSIS — L820 Inflamed seborrheic keratosis: Secondary | ICD-10-CM

## 2023-09-23 DIAGNOSIS — W908XXA Exposure to other nonionizing radiation, initial encounter: Secondary | ICD-10-CM

## 2023-09-23 DIAGNOSIS — L719 Rosacea, unspecified: Secondary | ICD-10-CM | POA: Diagnosis not present

## 2023-09-23 DIAGNOSIS — L578 Other skin changes due to chronic exposure to nonionizing radiation: Secondary | ICD-10-CM

## 2023-09-23 DIAGNOSIS — L814 Other melanin hyperpigmentation: Secondary | ICD-10-CM | POA: Diagnosis not present

## 2023-09-23 DIAGNOSIS — L821 Other seborrheic keratosis: Secondary | ICD-10-CM | POA: Diagnosis not present

## 2023-09-23 DIAGNOSIS — Z85828 Personal history of other malignant neoplasm of skin: Secondary | ICD-10-CM

## 2023-09-23 MED ORDER — METRONIDAZOLE 0.75 % EX CREA: TOPICAL_CREAM | CUTANEOUS | 11 refills | Status: AC

## 2023-09-23 NOTE — Progress Notes (Signed)
 Follow-Up Visit   Subjective  Shannon Pennington is a 83 y.o. female who presents for the following: 6 month follow-up sun-exposed areas. Patient has a pink spot on her right lower cheek to check, present for 1 month, not scaly. She also has a scab on the right lower leg. History of BCCs, Melanoma in situ.   The patient has spots, moles and lesions to be evaluated, some may be new or changing.   The following portions of the chart were reviewed this encounter and updated as appropriate: medications, allergies, medical history  Review of Systems:  No other skin or systemic complaints except as noted in HPI or Assessment and Plan.  Objective  Well appearing patient in no apparent distress; mood and affect are within normal limits.  A focused examination was performed of the following areas: Face, arms, legs Relevant physical exam findings are noted in the Assessment and Plan.  R lower pretibia x 1 Erythematous stuck-on, waxy papule or plaque  Assessment & Plan  ACTINIC DAMAGE - chronic, secondary to cumulative UV radiation exposure/sun exposure over time - diffuse scaly erythematous macules with underlying dyspigmentation - Recommend daily broad spectrum sunscreen SPF 30+ to sun-exposed areas, reapply every 2 hours as needed.  - Recommend staying in the shade or wearing long sleeves, sun glasses (UVA+UVB protection) and wide brim hats (4-inch brim around the entire circumference of the hat). - Call for new or changing lesions.  SEBORRHEIC KERATOSIS - Stuck-on, waxy, tan-brown papules and/or plaques  - Benign-appearing - Discussed benign etiology and prognosis. - Observe - Call for any changes  LENTIGINES Exam: scattered tan macules Due to sun exposure Treatment Plan: Benign-appearing, observe. Recommend daily broad spectrum sunscreen SPF 30+ to sun-exposed areas, reapply every 2 hours as needed.  Call for any changes  HISTORY OF BASAL CELL CARCINOMA OF THE SKIN with  Hypertrophic Scar Top of right shoulder, superficial, 04/2023 - No evidence of recurrence today - Recommend regular full body skin exams - Recommend daily broad spectrum sunscreen SPF 30+ to sun-exposed areas, reapply every 2 hours as needed.  - Call if any new or changing lesions are noted between office visits Recommend Serica moisturizing scar formula cream every night for the first year after a scar appears to help with scar remodeling if desired. Scars remodel on their own for a full year and will gradually improve in appearance over time.   ROSACEA Exam Mid face erythema with telangiectasias   Chronic condition with duration or expected duration over one year. Currently well-controlled.    Rosacea is a chronic progressive skin condition usually affecting the face of adults, causing redness and/or acne bumps. It is treatable but not curable. It sometimes affects the eyes (ocular rosacea) as well. It may respond to topical and/or systemic medication and can flare with stress, sun exposure, alcohol, exercise, topical steroids (including hydrocortisone/cortisone 10) and some foods.  Daily application of broad spectrum spf 30+ sunscreen to face is recommended to reduce flares.  Patient denies grittiness of the eyes  Treatment Plan Continue metronidazole  0.75% cream every morning. Continue Soolantra  at bedtime.  Dermatitis vs Inflamed Seborrheic Keratosis Exam: 1.0 cm Pink patch at right lower cheek  Treatment Plan: Start mometasone cream to aa twice daily until improved. Pt's husband has at home. RTC if no improvement.  ROSACEA   Related Medications Ivermectin  1 % CREA Apply to face QHS. metroNIDAZOLE  (METROCREAM ) 0.75 % cream Apply to face 1-2 times a day for rosacea. INFLAMED SEBORRHEIC KERATOSIS R  lower pretibia x 1 Symptomatic, irritating, patient would like treated. Destruction of lesion - R lower pretibia x 1  Destruction method: cryotherapy   Informed consent:  discussed and consent obtained   Lesion destroyed using liquid nitrogen: Yes   Region frozen until ice ball extended beyond lesion: Yes   Outcome: patient tolerated procedure well with no complications   Post-procedure details: wound care instructions given   Additional details:  Prior to procedure, discussed risks of blister formation, small wound, skin dyspigmentation, or rare scar following cryotherapy. Recommend Vaseline ointment to treated areas while healing.     Return in about 6 months (around 03/25/2024) for TBSE, Hx melanoma, Hx BCC, Hx AKs.  IAndrea Kerns, CMA, am acting as scribe for Rexene Rattler, MD .   Documentation: I have reviewed the above documentation for accuracy and completeness, and I agree with the above.  Rexene Rattler, MD

## 2023-09-23 NOTE — Patient Instructions (Addendum)

## 2023-10-12 ENCOUNTER — Encounter: Payer: Self-pay | Admitting: Cardiovascular Disease

## 2023-10-12 ENCOUNTER — Ambulatory Visit: Attending: Cardiovascular Disease | Admitting: Cardiovascular Disease

## 2023-10-12 VITALS — BP 162/62 | HR 68 | Ht 61.0 in | Wt 118.1 lb

## 2023-10-12 DIAGNOSIS — R0602 Shortness of breath: Secondary | ICD-10-CM | POA: Diagnosis not present

## 2023-10-12 DIAGNOSIS — E785 Hyperlipidemia, unspecified: Secondary | ICD-10-CM

## 2023-10-12 DIAGNOSIS — I10 Essential (primary) hypertension: Secondary | ICD-10-CM

## 2023-10-12 DIAGNOSIS — R002 Palpitations: Secondary | ICD-10-CM

## 2023-10-12 NOTE — Progress Notes (Signed)
 Cardiology Office Note   Date:  10/12/2023   ID:  Shannon, [name] Pennington, MRN [id number]  PCP:  Fernande Ophelia JINNY DOUGLAS, MD  Cardiologist:   Deatrice Cage, MD   Chief Complaint  Patient presents with   New Patient (Initial Visit)    Palpitations, Numbness and fatigue c/o sob with exertion. Meds reviewed verbally with pt.      History of Present Illness: Shannon Pennington is a 83 y.o. female who is self-referred for evaluation of shortness of breath and chest pain. She has known history of essential hypertension, hyperlipidemia, hiatal hernia, anxiety disorder, GERD and stage IIIa chronic kidney disease. She is not aware of any previous cardiac history.  She is not a smoker and has no family history of coronary artery disease. She goes on a walk with her husband almost on a daily basis.  Usually daily walk 1 mile twice a day.  She frequently gets shortness of breath that forces her to stop.  She has occasional substernal chest tightness with this.  She feels that some of the symptoms might be related to known hiatal hernia.  No orthopnea, PND or lower extremity edema.   Past Medical History:  Diagnosis Date   Anxiety    Basal cell carcinoma 12/17/2015   R abdomen    Basal cell carcinoma 03/23/2023   top of right shoulder, Superficial. 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     estradiol  (ESTRACE ) 1 MG tablet TAKE 1 TABLET BY MOUTH EVERY DAY 30 tablet 0   hydrochlorothiazide (HYDRODIURIL) 12.5 MG  tablet Take 12.5 mg by mouth daily.     Ivermectin  1 % CREA Apply to face QHS. 45 g 11   levocetirizine (XYZAL) 5 MG tablet Take 1 tablet by mouth every evening.     levothyroxine (SYNTHROID) 50 MCG tablet Take 50 mcg by mouth daily. 1/2 tablet (Patient taking differently: Take 25 mcg by mouth daily.)     LORazepam  (ATIVAN ) 0.5 MG tablet take 1 tablet by mouth once daily 30 tablet 4   medroxyPROGESTERone  (PROVERA ) 2.5 MG tablet TAKE 1 TABLET BY MOUTH EVERY DAY 30 tablet 0   metoprolol succinate (TOPROL-XL) 25 MG 24 hr tablet take 1/2 tablet by mouth once daily 30 tablet 12   metroNIDAZOLE  (METROCREAM ) 0.75 % cream Apply to face 1-2 times a day for rosacea. 45 g 11   omeprazole (PRILOSEC OTC) 20 MG tablet Take 20 mg by mouth daily.     No current facility-administered medications for this visit.    Allergies:   Amoxicillin -pot clavulanate, Levofloxacin, and Sulfa antibiotics    Social History:  The patient  reports that she quit smoking about 33 years ago. Her smoking use included cigarettes. She has never used smokeless tobacco. She reports that she does not drink alcohol and does not use drugs.   Family History:  The patient's family history includes Cancer in her brother  and father.    ROS:  Please see the history of present illness.   Otherwise, review of systems are positive for none.   All other systems are reviewed and negative.    PHYSICAL EXAM: VS:  BP (!) 162/62 (BP Location: Right Arm, Cuff Size: Normal)   Pulse 68   Ht 5' 1 (1.549 m)   Wt 118 lb 2 oz (53.6 kg)   SpO2 98%   BMI 22.32 kg/m  , BMI Body mass index is 22.32 kg/m. GEN: Well nourished, well developed, in no acute distress  HEENT: normal  Neck: no JVD, carotid bruits, or masses Cardiac: RRR; no  rubs, or gallops,no edema .  2 out of 6 systolic murmur in the aortic area Respiratory:  clear to auscultation bilaterally, normal work of breathing GI: soft, nontender, nondistended, + BS MS: no deformity or  atrophy  Skin: warm and dry, no rash Neuro:  Strength and sensation are intact Psych: euthymic mood, full affect   EKG:  EKG is ordered today. The ekg ordered today demonstrates : Normal sinus rhythm Low voltage QRS Possible Inferior infarct (cited on or before 29-Aug-2022) Non-specific ST-t changes    Recent Labs: No results found for requested labs within last 365 days.    Lipid Panel    Component Value Date/Time   CHOL 158 10/08/2014 1021   TRIG 103 10/08/2014 1021   HDL 61 10/08/2014 1021   CHOLHDL 2.6 10/08/2014 1021   LDLCALC 76 10/08/2014 1021      Wt Readings from Last 3 Encounters:  10/12/23 118 lb 2 oz (53.6 kg)  08/29/22 115 lb (52.2 kg)  06/15/22 115 lb 8 oz (52.4 kg)          10/12/2023    2:53 PM  PAD Screen  Previous PAD dx? No  Previous surgical procedure? No  Pain with walking? No  Feet/toe relief with dangling? No  Painful, non-healing ulcers? No  Extremities discolored? No      ASSESSMENT AND PLAN:  1.  Exertional dyspnea with occasional chest tightness: Concerning for atypical angina.  Her EKG is abnormal with minor ST depression globally. I recommend evaluation with a Lexiscan Myoview and an echocardiogram.  She wants to wait on doing the stress test for now as she is dealing with some family stress related to her grandson being treated for lymphoma.  2.  Essential hypertension: Blood pressure is elevated today but she seems to be anxious.  3.  Hyperlipidemia: Currently on atorvastatin 10 mg daily.  Most recent lipid profile showed an LDL of 61.    Disposition:   FU with me in 3 months  Signed,  Deatrice Cage, MD  10/12/2023 3:18 PM    [facility]

## 2023-10-12 NOTE — Patient Instructions (Signed)
 Medication Instructions:  No changes *If you need a refill on your cardiac medications before your next appointment, please call your pharmacy*  Lab Work: None ordered If you have labs (blood work) drawn today and your tests are completely normal, you will receive your results only by: MyChart Message (if you have MyChart) OR A paper copy in the mail If you have any lab test that is abnormal or we need to change your treatment, we will call you to review the results.  Testing/Procedures: Your physician has requested that you have an echocardiogram. Echocardiography is a painless test that uses sound waves to create images of your heart. It provides your doctor with information about the size and shape of your heart and how well your heart's chambers and valves are working.   You may receive an ultrasound enhancing agent through an IV if needed to better visualize your heart during the echo. This procedure takes approximately one hour.  There are no restrictions for this procedure.  This will take place at [address] [facility], [place] [address]  Please note: We ask at that you not bring children with you during ultrasound (echo/ vascular) testing. Due to room size and safety concerns, children are not allowed in the ultrasound rooms during exams. Our front office staff cannot provide observation of children in our lobby area while testing is being conducted. An adult accompanying a patient to their appointment will only be allowed in the ultrasound room at the discretion of the ultrasound technician under special circumstances. We apologize for any inconvenience.   Follow-Up: At [facility], you and your health needs are our priority.  As part of our continuing mission to provide you with exceptional heart care, our providers are all part of one team.  This team includes your primary Cardiologist (physician) and Advanced Practice Providers or APPs  (Physician Assistants and Nurse Practitioners) who all work together to provide you with the care you need, when you need it.  Your next appointment:   3 month(s)  Provider:   You may see Dr. Darron or one of the following Advanced Practice Providers on your designated Care Team:   Lonni Meager, NP Lesley Maffucci, PA-C Bernardino Bring, PA-C Cadence Concordia, PA-C Tylene Lunch, NP Barnie Hila, NP    We recommend signing up for the patient portal called MyChart.  Sign up information is provided on this After Visit Summary.  MyChart is used to connect with patients for Virtual Visits (Telemedicine).  Patients are able to view lab/test results, encounter notes, upcoming appointments, etc.  Non-urgent messages can be sent to your provider as well.   To learn more about what you can do with MyChart, go to [email or web address].

## 2023-11-17 ENCOUNTER — Ambulatory Visit: Admit: 2023-11-17 | Admitting: Ophthalmology

## 2023-11-17 SURGERY — PHACOEMULSIFICATION, CATARACT, WITH IOL INSERTION
Anesthesia: Topical | Laterality: Right

## 2023-11-22 ENCOUNTER — Other Ambulatory Visit: Payer: Self-pay | Admitting: Emergency Medicine

## 2023-11-22 ENCOUNTER — Ambulatory Visit

## 2023-11-22 ENCOUNTER — Telehealth: Payer: Self-pay | Admitting: Cardiovascular Disease

## 2023-11-22 DIAGNOSIS — R0602 Shortness of breath: Secondary | ICD-10-CM

## 2023-11-22 NOTE — Telephone Encounter (Signed)
 Husband cancelled echo today. He requested to reschedule for the end of January, 2026, but order expires on 12/26. Please assist.

## 2023-12-01 ENCOUNTER — Ambulatory Visit: Admit: 2023-12-01 | Admitting: Ophthalmology

## 2023-12-01 SURGERY — PHACOEMULSIFICATION, CATARACT, WITH IOL INSERTION
Anesthesia: Topical | Laterality: Left

## 2024-01-18 ENCOUNTER — Ambulatory Visit: Admitting: Cardiovascular Disease

## 2024-03-13 ENCOUNTER — Other Ambulatory Visit

## 2024-03-16 ENCOUNTER — Ambulatory Visit: Admitting: Physician Assistant

## 2024-03-20 ENCOUNTER — Ambulatory Visit: Admitting: Dermatology

## 2024-03-22 ENCOUNTER — Ambulatory Visit: Admitting: Dermatology

## 2024-10-02 ENCOUNTER — Ambulatory Visit: Admitting: Dermatology
